# Patient Record
Sex: Female | Born: 1995 | Race: White | Hispanic: No | Marital: Married | Smoking: Current some day smoker
Health system: Southern US, Community
[De-identification: ages and names within clinical notes are randomized; demographics above are authoritative.]

## PROBLEM LIST (undated history)

## (undated) ENCOUNTER — Inpatient Hospital Stay (HOSPITAL_COMMUNITY): Payer: Self-pay

## (undated) DIAGNOSIS — G43909 Migraine, unspecified, not intractable, without status migrainosus: Secondary | ICD-10-CM

## (undated) DIAGNOSIS — Q754 Mandibulofacial dysostosis: Secondary | ICD-10-CM

## (undated) HISTORY — PX: TYMPANOSTOMY: SHX2586

## (undated) HISTORY — DX: Mandibulofacial dysostosis: Q75.4

---
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
## 2017-08-18 IMAGING — US US OB TRANSVAGINAL
1 series · 13 of 28 positions shown · non-contrast
Comparison: None.

CLINICAL DATA: 22-year-old pregnant female with abdominal cramping
and recent spotting. Beta HCG 235.

EDC by LMP: [DATE], projecting to an expected gestational age of
8 weeks 6 days
EXAM:
OBSTETRIC <14 WK US AND TRANSVAGINAL OB US
TECHNIQUE: Both transabdominal and transvaginal ultrasound examinations were
performed for complete evaluation of the gestation as well as the
maternal uterus, adnexal regions, and pelvic cul-de-sac.
Transvaginal technique was performed to assess early pregnancy.

[Series 1: us ob transvaginal · 0.25mm/px · 13 of 78 slices shown]
[im 3/78]
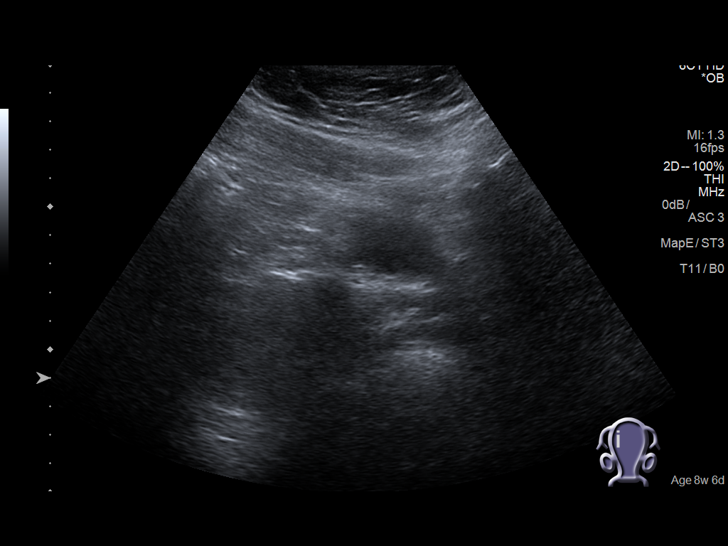
[im 9/78]
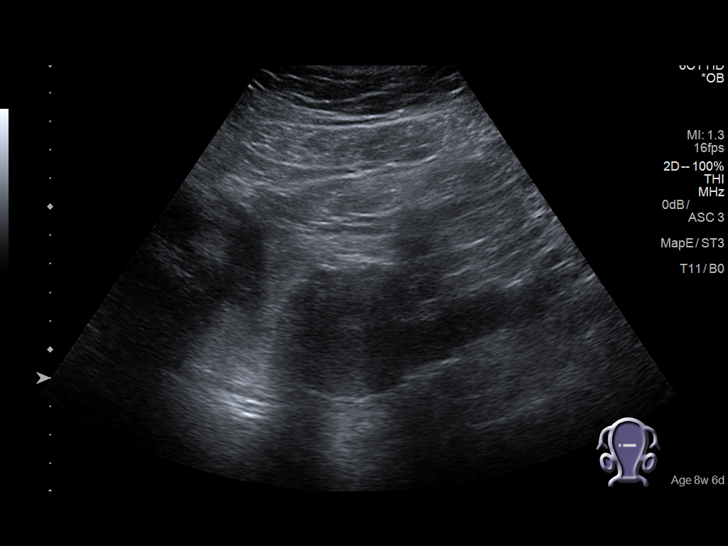
[im 15/78]
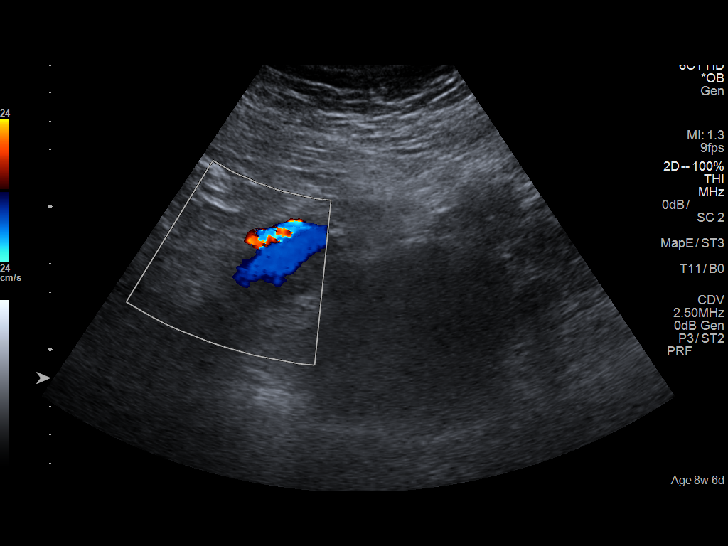
[im 20/78]
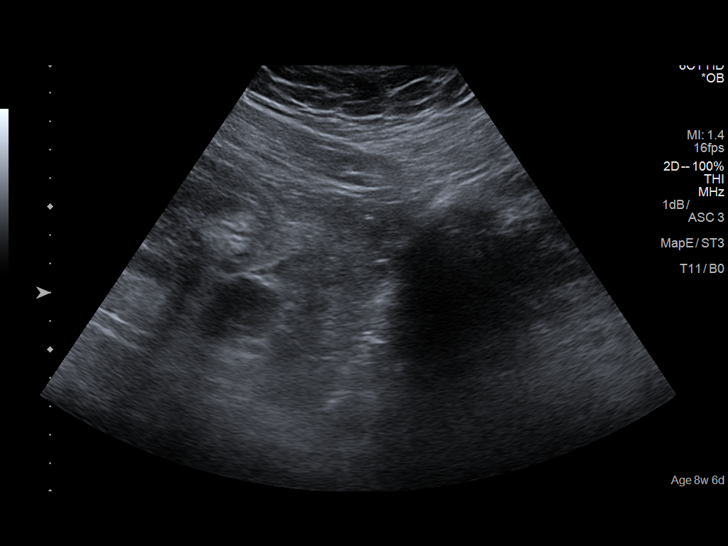
[im 26/78]
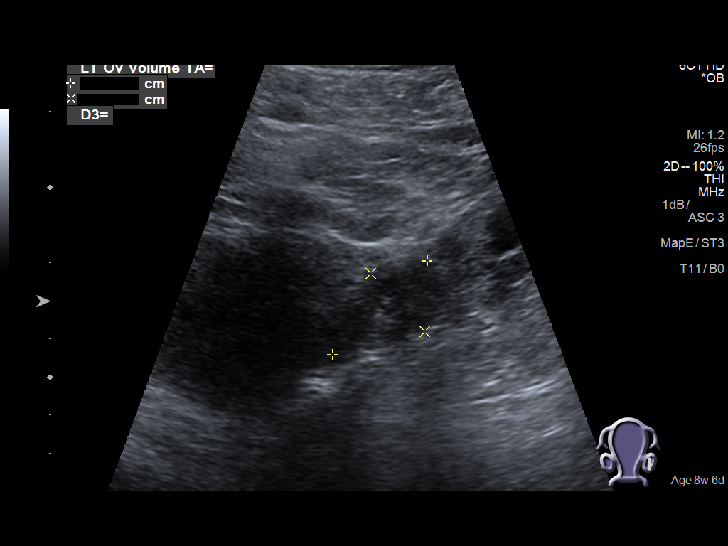
[im 32/78]
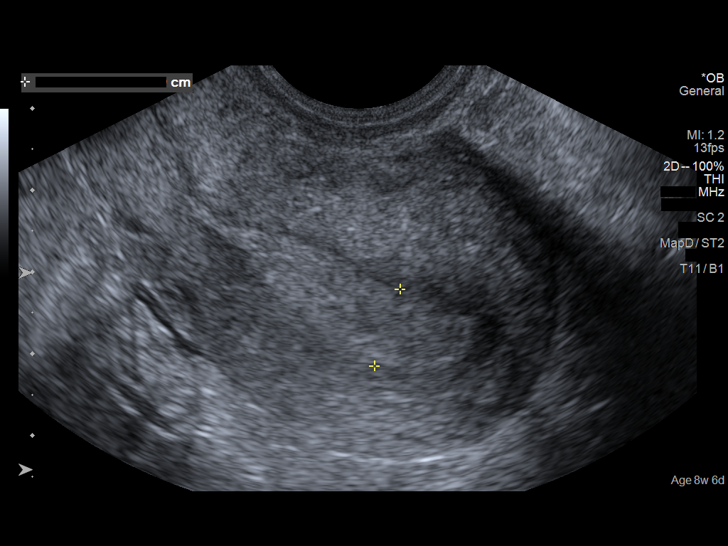
[im 40/78]
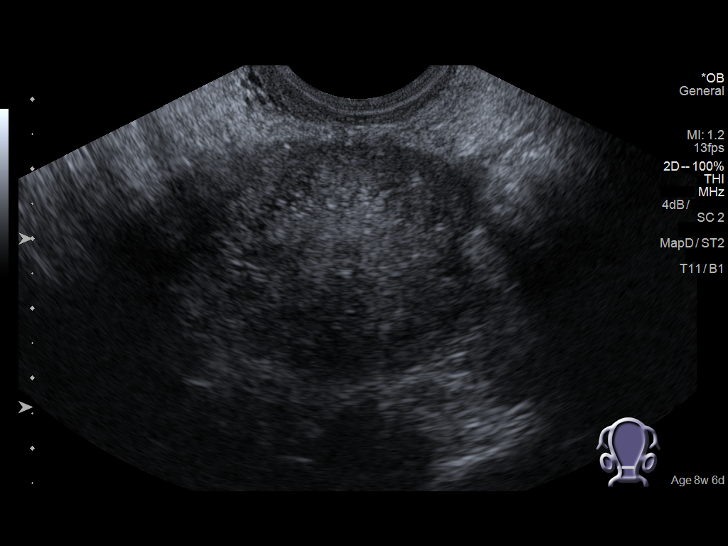
[im 46/78]
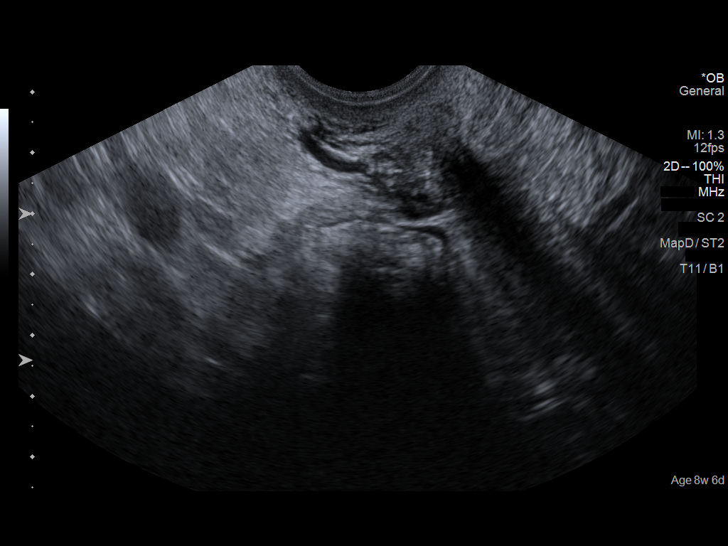
[im 52/78]
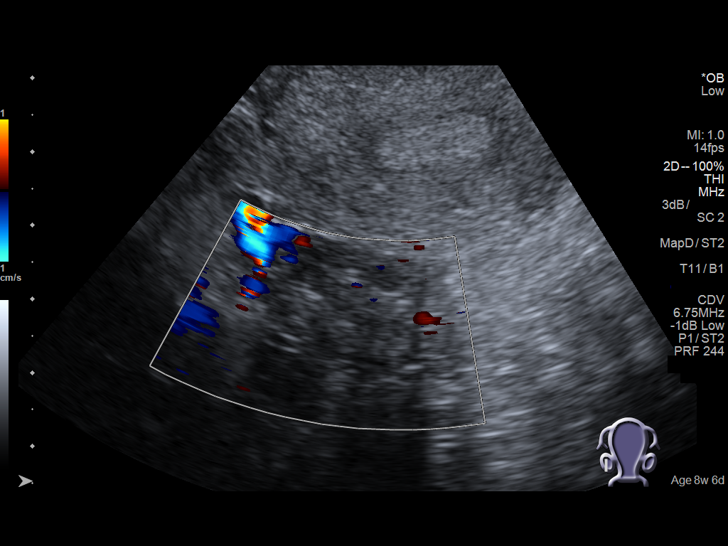
[im 58/78]
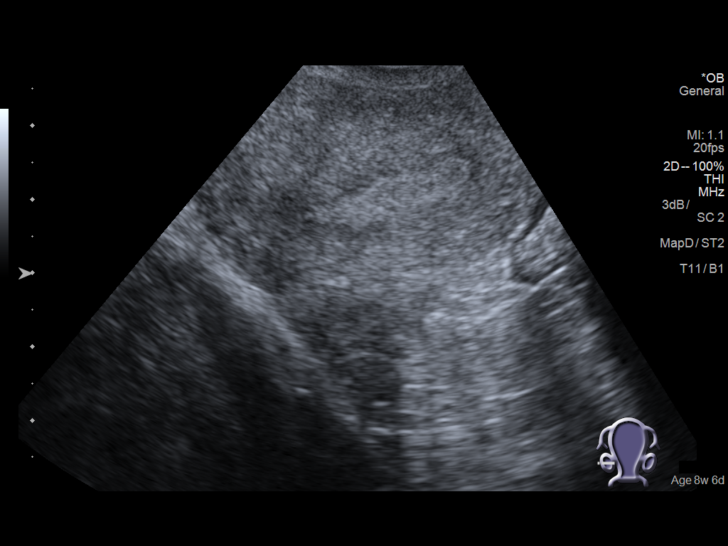
[im 63/78]
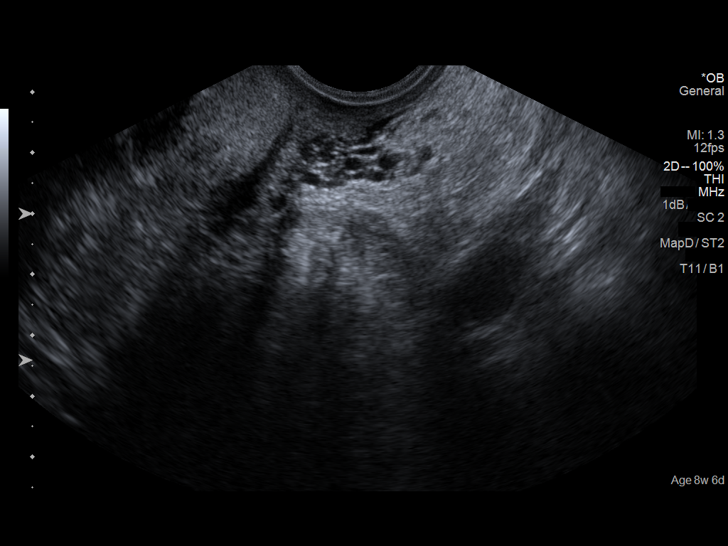
[im 69/78]
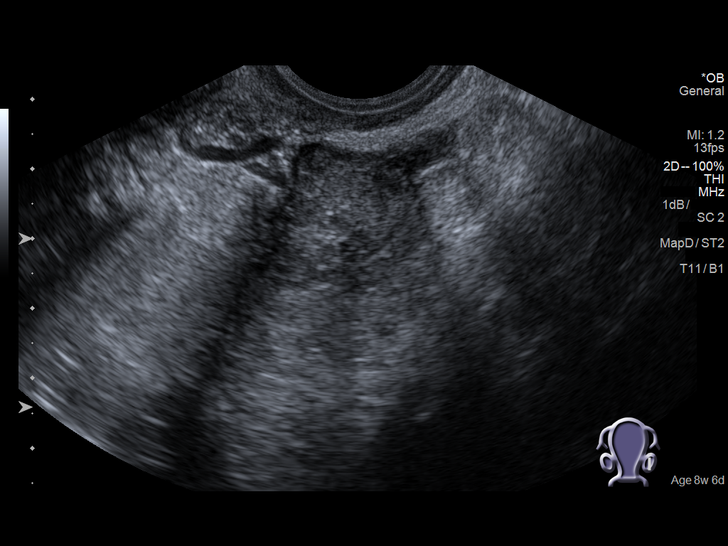
[im 75/78]
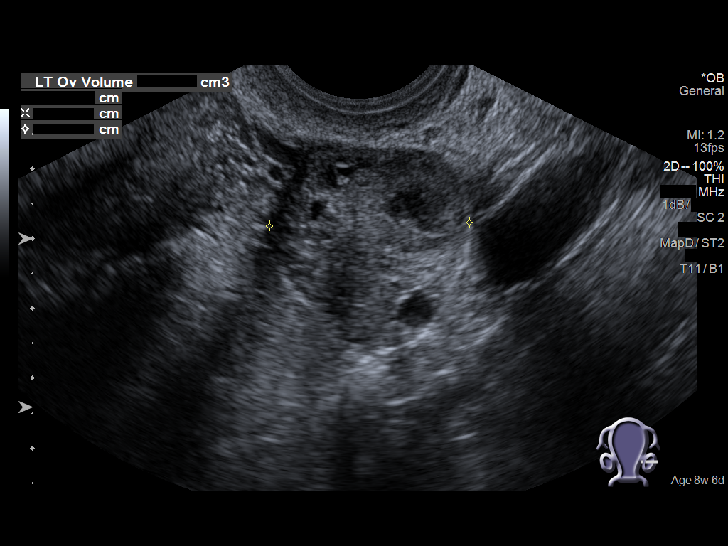

[13 of 28 positions shown; findings below may reference images not displayed]

FINDINGS: Intrauterine gestational sac: None

Maternal uterus/adnexae: Retroverted uterus. Bilayer endometrial
thickness 10 mm. No endometrial cavity fluid or focal endometrial
mass. No uterine fibroids. Right ovary measures 2.7 x 1.7 x 1.8 cm.
Left ovary measures 3.4 x 2.3 x 2.9 cm and contains a corpus luteum.
No abnormal ovarian or adnexal masses. Trace free fluid in the
pelvis.
IMPRESSION: Non-localization of the pregnancy on this scan. No intrauterine
gestational sac. No abnormal ovarian or adnexal masses. Sonographic
differential diagnosis includes intrauterine gestation too early to
visualize, spontaneous abortion or occult ectopic gestation.
Recommend close clinical follow-up and serial serum beta HCG
monitoring, with repeat obstetric scan as warranted by beta HCG
levels and clinical assessment.

## 2018-03-16 ENCOUNTER — Emergency Department: Payer: Medicaid Other

## 2018-03-16 ENCOUNTER — Emergency Department
Admission: EM | Admit: 2018-03-16 | Discharge: 2018-03-16 | Disposition: A | Discharge status: Home / Self Care | Payer: Medicaid Other | Attending: Emergency Medicine | Admitting: Emergency Medicine

## 2018-03-16 ENCOUNTER — Encounter: Payer: Self-pay | Admitting: Emergency Medicine

## 2018-03-16 DIAGNOSIS — R102 Pelvic and perineal pain: Secondary | ICD-10-CM | POA: Diagnosis not present

## 2018-03-16 DIAGNOSIS — R109 Unspecified abdominal pain: Secondary | ICD-10-CM

## 2018-03-16 DIAGNOSIS — Z79899 Other long term (current) drug therapy: Secondary | ICD-10-CM | POA: Diagnosis not present

## 2018-03-16 LAB — CBC
HCT: 39 % (ref 35.0–47.0)
HEMOGLOBIN: 13 g/dL (ref 12.0–16.0)
MCH: 27.8 pg (ref 26.0–34.0)
MCHC: 33.4 g/dL (ref 32.0–36.0)
MCV: 83.4 fL (ref 80.0–100.0)
Platelets: 257 10*3/uL (ref 150–440)
RBC: 4.67 MIL/uL (ref 3.80–5.20)
RDW: 16.3 % — ABNORMAL HIGH (ref 11.5–14.5)
WBC: 9.1 10*3/uL (ref 3.6–11.0)

## 2018-03-16 LAB — URINALYSIS, COMPLETE (UACMP) WITH MICROSCOPIC
BACTERIA UA: NONE SEEN
Bilirubin Urine: NEGATIVE
Glucose, UA: NEGATIVE mg/dL
Hgb urine dipstick: NEGATIVE
Ketones, ur: NEGATIVE mg/dL
Leukocytes, UA: NEGATIVE
Nitrite: NEGATIVE
PROTEIN: NEGATIVE mg/dL
Specific Gravity, Urine: 1.02 (ref 1.005–1.030)
pH: 5 (ref 5.0–8.0)

## 2018-03-16 LAB — COMPREHENSIVE METABOLIC PANEL
ALBUMIN: 3.5 g/dL (ref 3.5–5.0)
ALK PHOS: 63 U/L (ref 38–126)
ALT: 14 U/L (ref 14–54)
AST: 18 U/L (ref 15–41)
Anion gap: 7 (ref 5–15)
BUN: 13 mg/dL (ref 6–20)
CALCIUM: 8.9 mg/dL (ref 8.9–10.3)
CHLORIDE: 107 mmol/L (ref 101–111)
CO2: 24 mmol/L (ref 22–32)
Creatinine, Ser: 0.66 mg/dL (ref 0.44–1.00)
GFR calc Af Amer: >60 mL/min (ref >60)
GFR calc non Af Amer: >60 mL/min (ref >60)
GLUCOSE: 113 mg/dL — AB (ref 65–99)
Potassium: 4.5 mmol/L (ref 3.5–5.1)
SODIUM: 138 mmol/L (ref 135–145)
Total Bilirubin: 0.4 mg/dL (ref 0.3–1.2)
Total Protein: 6.7 g/dL (ref 6.5–8.1)

## 2018-03-16 LAB — WET PREP, GENITAL
Sperm: NONE SEEN
TRICH WET PREP: NONE SEEN
YEAST WET PREP: NONE SEEN

## 2018-03-16 LAB — POCT PREGNANCY, URINE: PREG TEST UR: NEGATIVE

## 2018-03-16 LAB — CHLAMYDIA/NGC RT PCR (ARMC ONLY)
Chlamydia Tr: NOT DETECTED
N GONORRHOEAE: NOT DETECTED

## 2018-03-16 LAB — HCG, QUANTITATIVE, PREGNANCY: HCG, BETA CHAIN, QUANT, S: 235 m[IU]/mL — AB (ref <5)

## 2018-03-16 LAB — LIPASE, BLOOD: LIPASE: 25 U/L (ref 11–51)

## 2018-03-16 MED ORDER — PRENATAL VITAMINS 0.8 MG PO TABS: 1.0000 | ORAL_TABLET | Freq: Every day | ORAL | 0 refills | Status: DC

## 2018-03-16 NOTE — ED Notes (Signed)
Patient transported to Ultrasound 

## 2018-03-16 NOTE — ED Provider Notes (Signed)
[facility] Emergency Department Provider Note ____________________________________________   First MD Initiated Contact with Patient 03/16/18 1723     (approximate)  I have reviewed the triage vital signs and the nursing notes.   HISTORY  Chief Complaint Possible Pregnancy and Abdominal Cramping  HPI Suzanne Frederick is a 22 y.o. female without any chronic medical conditions was presenting to the emergency department today complaining of intermittent lower abdominal pain as well as 2+ pregnancy tests earlier today.  She says that her last normal period was 2 months ago and that she bled only for 2 days in May and is now 2 days late for period in June.  She says that she usually has a regular..  She has never been pregnant before prior to her positive pregnancy test today.  Denies any pain at this time.  Denies any vaginal bleeding or discharge.  No history of STDs.   History reviewed. No pertinent past medical history.  There are no active problems to display for this patient.   History reviewed. No pertinent surgical history.  Prior to Admission medications   Medication Sig Start Date End Date Taking? Authorizing Provider  acetaminophen-codeine (TYLENOL #3) 300-30 MG tablet Take 1 tablet by mouth every 4 (four) hours as needed for moderate pain. 07/20/15   Beers, Charmayne Sheer, PA-C  phenazopyridine (PYRIDIUM) 200 MG tablet Take 1 tablet (200 mg total) by mouth 3 (three) times daily as needed for pain. 07/20/15   Beers, Charmayne Sheer, PA-C  promethazine (PHENERGAN) 25 MG tablet Take 1 tablet (25 mg total) by mouth every 6 (six) hours as needed for nausea or vomiting. 07/20/15   Beers, Charmayne Sheer, PA-C  sulfamethoxazole-trimethoprim (BACTRIM DS,SEPTRA DS) 800-160 MG tablet Take 1 tablet by mouth 2 (two) times daily. 07/20/15   Beers, Charmayne Sheer, PA-C    Allergies Patient has no known allergies.  No family history on file.  Social History Social History   Tobacco  Use  . Smoking status: Never Smoker  . Smokeless tobacco: Never Used  Substance Use Topics  . Alcohol use: No  . Drug use: Not on file    Review of Systems  Constitutional: No fever/chills Eyes: No visual changes. ENT: No sore throat. Cardiovascular: Denies chest pain. Respiratory: Denies shortness of breath. Gastrointestinal:   No nausea, no vomiting.  No diarrhea.  No constipation. Genitourinary: Negative for dysuria. Musculoskeletal: Negative for back pain. Skin: Negative for rash. Neurological: Negative for headaches, focal weakness or numbness.   ____________________________________________   PHYSICAL EXAM:  VITAL SIGNS: ED Triage Vitals  Enc Vitals Group     BP 03/16/18 1510 (!) 114/94     Pulse Rate 03/16/18 1510 99     Resp 03/16/18 1510 18     Temp 03/16/18 1510 99.2 F (37.3 C)     Temp Source 03/16/18 1510 Oral     SpO2 03/16/18 1510 98 %     Weight --      Height 03/16/18 1513 4\' 11"  (1.499 m)     Head Circumference --      Peak Flow --      Pain Score 03/16/18 1510 3     Pain Loc --      Pain Edu? --      Excl. in GC? --     Constitutional: Alert and oriented. Well appearing and in no acute distress. Eyes: Conjunctivae are normal.  Head: Atraumatic. Nose: No congestion/rhinnorhea. Mouth/Throat: Mucous membranes are moist.  Neck: No  stridor.   Cardiovascular: Normal rate, regular rhythm. Grossly normal heart sounds.   Respiratory: Normal respiratory effort.  No retractions. Lungs CTAB. Gastrointestinal: Soft and nontender. No distention. No CVA tenderness. Genitourinary: Normal external exam without any lesions.  Speculum exam without any bleeding.  Minimal cervical mucus versus white discharge.  Bimanual exam without CMT.  No uterine or adnexal masses nor tenderness but the patient's exam may be confounded by obesity. Musculoskeletal: No lower extremity tenderness nor edema.  No joint effusions. Neurologic:  Normal speech and language. No gross  focal neurologic deficits are appreciated. Skin:  Skin is warm, dry and intact. No rash noted. Psychiatric: Mood and affect are normal. Speech and behavior are normal.  ____________________________________________   LABS (all labs ordered are listed, but only abnormal results are displayed)  Labs Reviewed  WET PREP, GENITAL - Abnormal; Notable for the following components:      Result Value   Clue Cells Wet Prep HPF POC FEW (*)    WBC, Wet Prep HPF POC FEW (*)    All other components within normal limits  COMPREHENSIVE METABOLIC PANEL - Abnormal; Notable for the following components:   Glucose, Bld 113 (*)    All other components within normal limits  CBC - Abnormal; Notable for the following components:   RDW 16.3 (*)    All other components within normal limits  URINALYSIS, COMPLETE (UACMP) WITH MICROSCOPIC - Abnormal; Notable for the following components:   Color, Urine YELLOW (*)    APPearance CLOUDY (*)    All other components within normal limits  HCG, QUANTITATIVE, PREGNANCY - Abnormal; Notable for the following components:   hCG, Beta Chain, Quant, S 235 (*)    All other components within normal limits  CHLAMYDIA/NGC RT PCR ([facility] ONLY)  LIPASE, BLOOD  POC URINE PREG, ED  POCT PREGNANCY, URINE   ____________________________________________  EKG   ____________________________________________  RADIOLOGY  Ultrasound with non-localization of the pregnancy.  No intrauterine gestational sac.  No abnormal ovarian or adnexal masses. ____________________________________________   PROCEDURES  Procedure(s) performed:   Procedures  Critical Care performed:   ____________________________________________   INITIAL IMPRESSION / ASSESSMENT AND PLAN / ED COURSE  Pertinent labs & imaging results that were available during my care of the patient were reviewed by me and considered in my medical decision making (see chart for details).  Differential diagnosis  includes, but is not limited to, ovarian cyst, ovarian torsion, acute appendicitis, diverticulitis, urinary tract infection/pyelonephritis, endometriosis, bowel obstruction, colitis, renal colic, gastroenteritis, hernia, fibroids, endometriosis, pregnancy related pain including ectopic pregnancy, etc. As part of my medical decision making, I reviewed the following data within the electronic MEDICAL RECORD NUMBER Notes from prior ED visits  ----------------------------------------- 9:50 PM on 03/16/2018 -----------------------------------------  Patient asleep when I entered the room.  Woken easily.  Very benign overall exam.  It is not surprising that there was nonlocalization of the pregnancy on ultrasound as the beta is only 235.  Patient likely very early on in pregnancy.  We discussed that she must have a repeat beta-hCG in 48 hours and must return to the emergency department for any worsening concerning symptoms.  She will be given a prescription for prenatal vitamins.  She is understanding of the diagnosis as well as the treatment plan and willing to comply. ____________________________________________   FINAL CLINICAL IMPRESSION(S) / ED DIAGNOSES  Final diagnoses:  Pelvic pain  First trimester abdominal pain.    NEW MEDICATIONS STARTED DURING THIS VISIT:  New Prescriptions  No medications on file     Note:  This document was prepared using Dragon voice recognition software and may include unintentional dictation errors.     Myrna BlazerSchaevitz, Prudencio Velazco Matthew, MD 03/16/18 2151

## 2018-03-16 NOTE — ED Notes (Signed)
Pt c/o suprapubic cramping today, states she had 2 positive pregnancy test at home today. Denies any bleeding. States she does have some pain with urination.

## 2018-03-16 NOTE — ED Notes (Signed)

## 2018-03-16 NOTE — ED Triage Notes (Signed)
Patient presents to the ED with lower abdominal cramping that began last night around 7pm.  Patient states she took a pregnancy test and it was positive.  Patient states she had a very short period on April 3rd and her last normal period was March 5th.  Patient is in no obvious distress at this time.  Laughing during triage.  Patient denies vaginal bleeding.  Patient denies vomiting.

## 2018-03-19 ENCOUNTER — Ambulatory Visit (INDEPENDENT_AMBULATORY_CARE_PROVIDER_SITE_OTHER): Payer: Self-pay | Admitting: Obstetrics & Gynecology

## 2018-03-19 ENCOUNTER — Other Ambulatory Visit: Payer: Self-pay | Admitting: Obstetrics & Gynecology

## 2018-03-19 ENCOUNTER — Encounter: Payer: Self-pay | Admitting: Obstetrics & Gynecology

## 2018-03-19 ENCOUNTER — Ambulatory Visit: Payer: Self-pay | Admitting: Obstetrics & Gynecology

## 2018-03-19 VITALS — BP 120/80 | Ht 59.0 in | Wt 220.0 lb

## 2018-03-19 DIAGNOSIS — N926 Irregular menstruation, unspecified: Secondary | ICD-10-CM

## 2018-03-19 DIAGNOSIS — N921 Excessive and frequent menstruation with irregular cycle: Secondary | ICD-10-CM

## 2018-03-19 LAB — BETA HCG QUANT (REF LAB): hCG Quant: 682 m[IU]/mL

## 2018-03-19 NOTE — Progress Notes (Signed)
   Patient at 4-[redacted] weeks gestation by LMP.  Patient reports irregular menses since this year.  She is not in acute distress.  Ectopic risks include none. Cycle length is irregular. Pregnancy testing: at home and quant HCG level 235 on 03/16/18. Pregnancy imaging: transvaginal ultrasound done on 6/4. Result too early to tell, no mass or fluid.  Blood type: unk.  Other lab results: none.   PMHx: She  has no past medical history on file. Also,  has no past surgical history on file., family history is not on file.,  reports that she has never smoked. She has never used smokeless tobacco. She reports that she does not drink alcohol.  She has a current medication list which includes the following prescription(s): propranolol, acetaminophen-codeine, phenazopyridine, prenatal vitamins, promethazine, and sulfamethoxazole-trimethoprim. Also, has No Known Allergies.  Review of Systems  Constitutional: Negative for chills, fever and malaise/fatigue.  HENT: Negative for congestion, sinus pain and sore throat.   Eyes: Negative for blurred vision and pain.  Respiratory: Negative for cough and wheezing.   Cardiovascular: Negative for chest pain and leg swelling.  Gastrointestinal: Negative for abdominal pain, constipation, diarrhea, heartburn, nausea and vomiting.  Genitourinary: Negative for dysuria, frequency, hematuria and urgency.  Musculoskeletal: Negative for back pain, joint pain, myalgias and neck pain.  Skin: Negative for itching and rash.  Neurological: Negative for dizziness, tremors and weakness.  Endo/Heme/Allergies: Does not bruise/bleed easily.  Psychiatric/Behavioral: Negative for depression. The patient is not nervous/anxious and does not have insomnia.    Objective: BP 120/80   Ht 4\' 11"  (1.499 m)   Wt 220 lb (99.8 kg)   LMP 01/13/2018   BMI 44.43 kg/m  Physical Exam  Constitutional: She is oriented to person, place, and time. She appears well-developed and well-nourished. No distress.    Musculoskeletal: Normal range of motion.  Neurological: She is alert and oriented to person, place, and time.  Skin: Skin is warm and dry.  Psychiatric: She has a normal mood and affect.  Vitals reviewed.  ASSESSMENT/PLAN:    Irregular menses    -  Primary   Relevant Orders   Beta hCG quant (ref lab) Monitor for sx's of miscarriage or ectopic F/U one week; US when appropriate PNV samples given (Rx to follow) Sch NOB      Annamarie MajorPaul Elodie Panameno, MD, Merlinda FrederickFACOG [facility], [facility] 03/19/2018  11:33 AM

## 2018-03-25 ENCOUNTER — Telehealth: Payer: Self-pay

## 2018-03-25 NOTE — Telephone Encounter (Signed)
After Hours Triage call: Pt reports she is [redacted] wks pregnant & started bleeding this morning. Had phone sex last night. [id number]#[phone]#[phone]

## 2018-03-29 ENCOUNTER — Other Ambulatory Visit: Payer: Self-pay | Admitting: Obstetrics & Gynecology

## 2018-03-29 ENCOUNTER — Ambulatory Visit (INDEPENDENT_AMBULATORY_CARE_PROVIDER_SITE_OTHER): Payer: Self-pay

## 2018-03-29 ENCOUNTER — Ambulatory Visit (INDEPENDENT_AMBULATORY_CARE_PROVIDER_SITE_OTHER): Payer: Self-pay | Admitting: Obstetrics & Gynecology

## 2018-03-29 ENCOUNTER — Encounter: Payer: Self-pay | Admitting: Obstetrics & Gynecology

## 2018-03-29 VITALS — BP 120/80 | Ht 59.0 in | Wt 221.0 lb

## 2018-03-29 DIAGNOSIS — O209 Hemorrhage in early pregnancy, unspecified: Secondary | ICD-10-CM

## 2018-03-29 DIAGNOSIS — N921 Excessive and frequent menstruation with irregular cycle: Secondary | ICD-10-CM

## 2018-03-29 DIAGNOSIS — Z3A10 10 weeks gestation of pregnancy: Secondary | ICD-10-CM

## 2018-03-29 NOTE — Patient Instructions (Signed)

## 2018-03-29 NOTE — Progress Notes (Signed)
  HPI: Threatened Abortion Patient [redacted] weeks gestation by LMP.  Patient reports bleeding since 6 days ago.  She is not in acute distress.  Ectopic risks include none. Cycle length is regular. Pregnancy testing: at home. Pregnancy imaging: done.  Blood type: unk.  Other lab results: none.  Ultrasound demonstrates IUP w FHT 105 These findings are reassuring w new EDC  PMHx: She  has a past medical history of Treacher Collins syndrome. Also,  has no past surgical history on file., family history includes Treacher Collins syndrome in her sister.,  reports that she has never smoked. She has never used smokeless tobacco. She reports that she does not drink alcohol.  She has a current medication list which includes the following prescription(s): prenatal vitamins, propranolol, acetaminophen-codeine, phenazopyridine, promethazine, and sulfamethoxazole-trimethoprim. Also, has No Known Allergies.  Review of Systems  All other systems reviewed and are negative.   Objective: BP 120/80   Ht 4\' 11"  (1.499 m)   Wt 221 lb (100.2 kg)   LMP 01/13/2018   BMI 44.64 kg/m   Physical examination Constitutional NAD, Conversant  Skin No rashes, lesions or ulceration.   Extremities: Moves all appropriately.  Normal ROM for age. No lymphadenopathy.  Neuro: Grossly intact  Psych: Oriented to PPT.  Normal mood. Normal affect.   No results found.  Assessment:  First trimester bleeding - Plan: US OB Transvaginal US f/u w NOB 2 weeks, sooner if new bleeding occurs  Annamarie MajorPaul Harris, MD, Merlinda FrederickFACOG [facility], [facility] 03/29/2018  2:24 PM

## 2018-03-29 NOTE — Telephone Encounter (Signed)
Pt seen in clinic today.  

## 2018-04-13 ENCOUNTER — Encounter: Payer: Self-pay | Admitting: Obstetrics and Gynecology

## 2018-04-13 ENCOUNTER — Ambulatory Visit (INDEPENDENT_AMBULATORY_CARE_PROVIDER_SITE_OTHER): Payer: Self-pay | Admitting: Obstetrics and Gynecology

## 2018-04-13 ENCOUNTER — Ambulatory Visit (INDEPENDENT_AMBULATORY_CARE_PROVIDER_SITE_OTHER): Payer: Self-pay

## 2018-04-13 VITALS — BP 126/84 | Ht 59.0 in | Wt 272.0 lb

## 2018-04-13 DIAGNOSIS — Z3A08 8 weeks gestation of pregnancy: Secondary | ICD-10-CM

## 2018-04-13 DIAGNOSIS — N8312 Corpus luteum cyst of left ovary: Secondary | ICD-10-CM

## 2018-04-13 DIAGNOSIS — Z3401 Encounter for supervision of normal first pregnancy, first trimester: Secondary | ICD-10-CM

## 2018-04-13 DIAGNOSIS — Z124 Encounter for screening for malignant neoplasm of cervix: Secondary | ICD-10-CM

## 2018-04-13 DIAGNOSIS — O209 Hemorrhage in early pregnancy, unspecified: Secondary | ICD-10-CM

## 2018-04-13 DIAGNOSIS — O219 Vomiting of pregnancy, unspecified: Secondary | ICD-10-CM

## 2018-04-13 DIAGNOSIS — O3481 Maternal care for other abnormalities of pelvic organs, first trimester: Secondary | ICD-10-CM

## 2018-04-13 DIAGNOSIS — Z6841 Body Mass Index (BMI) 40.0 and over, adult: Secondary | ICD-10-CM

## 2018-04-13 MED ORDER — DOXYLAMINE SUCCINATE (SLEEP) 25 MG PO TABS: 25.0000 mg | ORAL_TABLET | Freq: Every day | ORAL | 11 refills | Status: DC

## 2018-04-13 MED ORDER — FOLIC ACID 1 MG PO TABS
1.0000 mg | ORAL_TABLET | Freq: Every day | ORAL | 11 refills | Status: DC
Start: 2018-04-13 — End: 2018-05-11

## 2018-04-13 MED ORDER — PYRIDOXINE HCL 25 MG PO TABS: 25.0000 mg | ORAL_TABLET | Freq: Four times a day (QID) | ORAL | 11 refills | Status: DC | PRN

## 2018-04-13 NOTE — Progress Notes (Signed)
NOB  U/S Severe Nausea

## 2018-04-13 NOTE — Progress Notes (Signed)
Patient ID: Suzanne LinkMadison D Marchetta, female   DOB: 03/18/1996, 22 y.o.   MRN: [id number]  Reason for Consult: Initial Prenatal Visit   [facility]erred by [facility]  Subjective:     HPI:  Suzanne Frederick is a [age] y.o. female . She is following up today for an ultrasound to confirm her pregnancy. She has been doing well. She has had some nausea vomiting. She reports that she generally vomits about 2 times a week. She was not yet started any medications of therapies. She is taking a prenatal vitamin. She is not having any vaginal spotting. US today confirms her due date for this pregnancy will be 11/21/2017  Past Medical History:  Diagnosis Date  . Treacher Collins syndrome    Family History  Problem Relation Age of Onset  . Treacher Collins syndrome Sister    History reviewed. No pertinent surgical history.  Short Social History:  Social History   Tobacco Use  . Smoking status: Never Smoker  . Smokeless tobacco: Never Used  Substance Use Topics  . Alcohol use: No    No Known Allergies  Current Outpatient Medications  Medication Sig Dispense Refill  . Prenatal Multivit-Min-Fe-FA (PRENATAL VITAMINS) 0.8 MG tablet Take 1 tablet by mouth daily. 30 tablet 0  . propranolol (INDERAL) 40 MG tablet Take by mouth.    Marland Kitchen. acetaminophen-codeine (TYLENOL #3) 300-30 MG tablet Take 1 tablet by mouth every 4 (four) hours as needed for moderate pain. (Patient not taking: Reported on 03/19/2018) 12 tablet 0  . doxylamine, Sleep, (UNISOM) 25 MG tablet Take 1 tablet (25 mg total) by mouth at bedtime. 30 tablet 11  . folic acid (FOLVITE) 1 MG tablet Take 1 tablet (1 mg total) by mouth daily. 30 tablet 11  . phenazopyridine (PYRIDIUM) 200 MG tablet Take 1 tablet (200 mg total) by mouth 3 (three) times daily as needed for pain. (Patient not taking: Reported on 03/19/2018) 6 tablet 0  . promethazine (PHENERGAN) 25 MG tablet Take 1 tablet (25 mg total) by mouth every 6 (six) hours as needed for nausea or  vomiting. (Patient not taking: Reported on 03/19/2018) 15 tablet 0  . pyridOXINE (VITAMIN B-6) 25 MG tablet Take 1 tablet (25 mg total) by mouth 4 (four) times daily as needed (nausea and vomiting). 120 tablet 11  . sulfamethoxazole-trimethoprim (BACTRIM DS,SEPTRA DS) 800-160 MG tablet Take 1 tablet by mouth 2 (two) times daily. (Patient not taking: Reported on 03/19/2018) 20 tablet 0   No current facility-administered medications for this visit.     Review of Systems  Constitutional: Negative for chills, fatigue, fever and unexpected weight change.  HENT: Negative for trouble swallowing.  Eyes: Negative for loss of vision.  Respiratory: Negative for cough, shortness of breath and wheezing.  Cardiovascular: Negative for chest pain, leg swelling, palpitations and syncope.  GI: Negative for abdominal pain, blood in stool, diarrhea, nausea and vomiting.  GU: Negative for difficulty urinating, dysuria, frequency and hematuria.  Musculoskeletal: Negative for back pain, leg pain and joint pain.  Skin: Negative for rash.  Neurological: Negative for dizziness, headaches, light-headedness, numbness and seizures.  Psychiatric: Negative for behavioral problem, confusion, depressed mood and sleep disturbance.        Objective:  Objective   Vitals:   04/13/18 0954  BP: 126/84  Weight: 272 lb (123.4 kg)   Body mass index is 54.94 kg/m.  Physical Exam  Constitutional: She is oriented to person, place, and time. She appears well-developed and well-nourished.  HENT:  Head: Normocephalic and atraumatic.  Eyes: Pupils are equal, round, and reactive to light. EOM are normal.  Cardiovascular: Normal rate.  Pulmonary/Chest: Effort normal. No respiratory distress.  Neurological: She is alert and oriented to person, place, and time.  Skin: Skin is warm and dry.  Psychiatric: She has a normal mood and affect. Her behavior is normal. Judgment and thought content normal.  Nursing note and vitals  reviewed.       Assessment/Plan:    22 yo G1P0 [redacted]w[redacted]d by LMP = 8 week [place] Hayleigh's BMI is more than 50.  We discussed weight gain goals and exercise initiation during pregnancy. Patient does not currently exercise. I encouraged her to try adding in walks 2 times a day.  Will have patient follow up with a [facility] to establish pregnancy care.  Prescriptions sent for B6, Unisom and folic acid.   More than 25 minutes were spent face to face with the patient in the room with more than 50% of the time spent providing counseling and discussing the plan of management.    Adelene Idler MD [facility], [facility] 04/13/18 10:59 AM

## 2018-04-15 LAB — URINE DRUG PANEL 7
AMPHETAMINES, URINE: NEGATIVE ng/mL
BENZODIAZEPINE QUANT UR: NEGATIVE ng/mL
Barbiturate Quant, Ur: NEGATIVE ng/mL
Cannabinoid Quant, Ur: NEGATIVE ng/mL
Cocaine (Metab.): NEGATIVE ng/mL
Opiate Quant, Ur: NEGATIVE ng/mL
PCP Quant, Ur: NEGATIVE ng/mL

## 2018-04-16 ENCOUNTER — Telehealth: Payer: Self-pay | Admitting: Radiology

## 2018-04-16 NOTE — Telephone Encounter (Signed)
Left voicemail for patient to call [id number] to schedule New OB appointment.

## 2018-05-11 ENCOUNTER — Other Ambulatory Visit (HOSPITAL_COMMUNITY)
Admission: RE | Admit: 2018-05-11 | Discharge: 2018-05-11 | Disposition: A | Discharge status: Home / Self Care | Payer: Medicaid Other | Source: Ambulatory Visit | Attending: Family Medicine | Admitting: Family Medicine

## 2018-05-11 ENCOUNTER — Encounter: Payer: Self-pay | Admitting: Family Medicine

## 2018-05-11 ENCOUNTER — Ambulatory Visit (INDEPENDENT_AMBULATORY_CARE_PROVIDER_SITE_OTHER): Payer: Medicaid Other | Admitting: Family Medicine

## 2018-05-11 VITALS — BP 154/91 | HR 107 | Wt 268.0 lb

## 2018-05-11 DIAGNOSIS — F331 Major depressive disorder, recurrent, moderate: Secondary | ICD-10-CM

## 2018-05-11 DIAGNOSIS — Z3A12 12 weeks gestation of pregnancy: Secondary | ICD-10-CM | POA: Diagnosis not present

## 2018-05-11 DIAGNOSIS — O99341 Other mental disorders complicating pregnancy, first trimester: Secondary | ICD-10-CM | POA: Insufficient documentation

## 2018-05-11 DIAGNOSIS — Q754 Mandibulofacial dysostosis: Secondary | ICD-10-CM | POA: Diagnosis not present

## 2018-05-11 DIAGNOSIS — F909 Attention-deficit hyperactivity disorder, unspecified type: Secondary | ICD-10-CM | POA: Insufficient documentation

## 2018-05-11 DIAGNOSIS — R0683 Snoring: Secondary | ICD-10-CM | POA: Insufficient documentation

## 2018-05-11 DIAGNOSIS — O99211 Obesity complicating pregnancy, first trimester: Secondary | ICD-10-CM

## 2018-05-11 DIAGNOSIS — Z3401 Encounter for supervision of normal first pregnancy, first trimester: Secondary | ICD-10-CM

## 2018-05-11 DIAGNOSIS — O99210 Obesity complicating pregnancy, unspecified trimester: Secondary | ICD-10-CM

## 2018-05-11 DIAGNOSIS — O219 Vomiting of pregnancy, unspecified: Secondary | ICD-10-CM

## 2018-05-11 DIAGNOSIS — Z87891 Personal history of nicotine dependence: Secondary | ICD-10-CM | POA: Diagnosis not present

## 2018-05-11 DIAGNOSIS — O0990 Supervision of high risk pregnancy, unspecified, unspecified trimester: Secondary | ICD-10-CM | POA: Insufficient documentation

## 2018-05-11 DIAGNOSIS — Z3491 Encounter for supervision of normal pregnancy, unspecified, first trimester: Secondary | ICD-10-CM

## 2018-05-11 MED ORDER — PRENATAL VITAMIN 27-0.8 MG PO TABS: 1.0000 | ORAL_TABLET | Freq: Every day | ORAL | 3 refills | Status: DC

## 2018-05-11 MED ORDER — DOXYLAMINE-PYRIDOXINE 10-10 MG PO TBEC: 1.0000 | DELAYED_RELEASE_TABLET | Freq: Four times a day (QID) | ORAL | 3 refills | Status: DC

## 2018-05-11 MED ORDER — SERTRALINE HCL 100 MG PO TABS
100.0000 mg | ORAL_TABLET | Freq: Every day | ORAL | 3 refills | Status: DC
Start: 2018-05-11 — End: 2018-10-26

## 2018-05-11 MED ORDER — ASPIRIN 81 MG PO CHEW: 81.0000 mg | CHEWABLE_TABLET | Freq: Every day | ORAL | 3 refills | Status: DC

## 2018-05-11 NOTE — Progress Notes (Signed)
Subjective:   Suzanne Frederick is a 22 y.o. G1P0 at 6630w2d by early ultrasound being seen today for her first obstetrical visit.  Her obstetrical history is significant for obesity and Treacher Collins. Patient does intend to breast feed. Pregnancy history fully reviewed.  Patient reports nausea.  HISTORY: OB History  Gravida Para Term Preterm AB Living  1 0 0 0 0 0  SAB TAB Ectopic Multiple Live Births  0 0 0 0 0    # Outcome Date GA Lbr Len/2nd Weight Sex Delivery Anes PTL Lv  1 Current             Past Medical History:  Diagnosis Date  . Treacher Collins syndrome    Past Surgical History:  Procedure Laterality Date  . TYMPANOSTOMY     Family History  Problem Relation Age of Onset  . Treacher Collins syndrome Sister   . ADD / ADHD Sister   . Diabetes Mother   . Hypertension Mother   . Hypertension Father   . Hypertension Paternal Uncle   . Hypertension Maternal Grandfather   . Hypertension Paternal Grandmother   . Hyperlipidemia Paternal Grandmother   . Hypertension Paternal Grandfather   . Hyperlipidemia Paternal Grandfather   . Cancer Maternal Uncle    Social History   Tobacco Use  . Smoking status: Former Smoker    Last attempt to quit: 03/11/2018    Years since quitting: 0.1  . Smokeless tobacco: Never Used  Substance Use Topics  . Alcohol use: No  . Drug use: Not on file   No Known Allergies Current Outpatient Medications on File Prior to Visit  Medication Sig Dispense Refill  . propranolol (INDERAL) 40 MG tablet Take by mouth.     No current facility-administered medications on file prior to visit.      Exam   Vitals:   05/11/18 1014  BP: (!) 154/91  Pulse: (!) 107  Weight: 268 lb (121.6 kg)      Uterus:   12 wk size  Pelvic Exam: Perineum: no hemorrhoids, normal perineum   Vulva: normal external genitalia, no lesions   Vagina:  normal mucosa, normal discharge   Cervix: no lesions and normal, pap smear done.    Adnexa: normal  adnexa and no mass, fullness, tenderness   Bony Pelvis: average  System: General: well-developed, well-nourished female in no acute distress   Breast:  normal appearance, no masses or tenderness   Skin: normal coloration and turgor, no rashes   Neurologic: oriented, normal, negative, normal mood   Extremities: normal strength, tone, and muscle mass, ROM of all joints is normal   HEENT extraocular movement intact and sclera clear, anicteric   Mouth/Teeth mucous membranes moist, pharynx normal without lesions and dental hygiene good   Neck supple and no masses   Cardiovascular: regular rate and rhythm   Respiratory:  no respiratory distress, normal breath sounds   Abdomen: soft, non-tender; bowel sounds normal; no masses,  no organomegaly     Assessment:   Pregnancy: G1P0 Patient Active Problem List   Diagnosis Date Noted  . Encounter for supervision of low-risk pregnancy in first trimester 05/11/2018  . Obesity complicating peripregnancy, antepartum 05/11/2018  . Obesity, morbid, BMI 50 or higher (HCC) 05/11/2018  . Nausea and vomiting of pregnancy, antepartum 05/11/2018  . ADHD (attention deficit hyperactivity disorder) 02/22/2013  . Depression 02/22/2013  . Menorrhagia 02/22/2013  . Headache 02/22/2013  . 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  Plan:  1. Encounter for supervision of low-risk pregnancy in first trimester New OB labs - Obstetric Panel, Including HIV - Hemoglobinopathy evaluation - Cytology - PAP - Cystic Fibrosis Mutation 97 - Culture, OB Urine - SMN1 Copy Number Analysis - Genetic Screening - Prenatal Vit-Fe Fumarate-FA (PRENATAL VITAMIN) 27-0.8 MG TABS; Take 1 tablet by mouth daily.  Dispense: 90 tablet; Refill: 3 - Enroll Patient in [employer]s  2. Moderate episode of recurrent major depressive disorder (HCC) Ok to resume zoloft--needs this right now - sertraline (ZOLOFT) 100 MG tablet; Take 1 tablet (100 mg total) by mouth daily.  Dispense: 90  tablet; Refill: 3  3. Treacher Collins syndrome Autosomal dominant - Ambulatory referral to Genetics  4. Nausea and vomiting of pregnancy, antepartum - Doxylamine-Pyridoxine (DICLEGIS) 10-10 MG TBEC; Take 1 tablet by mouth 4 (four) times daily. Take 2 pills q hs, add one q am, one at lunch  Dispense: 100 tablet; Refill: 3  5. Obesity complicating peripregnancy, antepartum - aspirin 81 MG chewable tablet; Chew 1 tablet (81 mg total) by mouth daily.  Dispense: 90 tablet; Refill: 3 - Hemoglobin A1c - Declined nutrition referral Watch BP--upset today  6. Snoring - Ambulatory referral to Sleep Studies  Initial labs drawn. Continue prenatal vitamins. Genetic Screening discussed, NIPS: ordered. Ultrasound discussed; fetal anatomic survey: Problem list reviewed and updated. The nature of [facility] with multiple MDs and other Advanced Practice Providers was explained to patient; also emphasized that residents, students are part of our team. Routine obstetric precautions reviewed. Return in 1 month (on 06/08/2018).

## 2018-05-11 NOTE — Patient Instructions (Addendum)
 Breastfeeding Choosing to breastfeed is one of the best decisions you can make for yourself and your baby. A change in hormones during pregnancy causes your breasts to make breast milk in your milk-producing glands. Hormones prevent breast milk from being released before your baby is born. They also prompt milk flow after birth. Once breastfeeding has begun, thoughts of your baby, as well as his or her sucking or crying, can stimulate the release of milk from your milk-producing glands. Benefits of breastfeeding Research shows that breastfeeding offers many health benefits for infants and mothers. It also offers a cost-free and convenient way to feed your baby. For your baby  Your first milk (colostrum) helps your baby's digestive system to function better.  Special cells in your milk (antibodies) help your baby to fight off infections.  Breastfed babies are less likely to develop asthma, allergies, obesity, or type 2 diabetes. They are also at lower risk for sudden infant death syndrome (SIDS).  Nutrients in breast milk are better able to meet your baby's needs compared to infant formula.  Breast milk improves your baby's brain development. For you  Breastfeeding helps to create a very special bond between you and your baby.  Breastfeeding is convenient. Breast milk costs nothing and is always available at the correct temperature.  Breastfeeding helps to burn calories. It helps you to lose the weight that you gained during pregnancy.  Breastfeeding makes your uterus return faster to its size before pregnancy. It also slows bleeding (lochia) after you give birth.  Breastfeeding helps to lower your risk of developing type 2 diabetes, osteoporosis, rheumatoid arthritis, cardiovascular disease, and breast, ovarian, uterine, and endometrial cancer later in life. Breastfeeding basics Starting breastfeeding  Find a comfortable place to sit or lie down, with your neck and back  well-supported.  Place a pillow or a rolled-up blanket under your baby to bring him or her to the level of your breast (if you are seated). Nursing pillows are specially designed to help support your arms and your baby while you breastfeed.  Make sure that your baby's tummy (abdomen) is facing your abdomen.  Gently massage your breast. With your fingertips, massage from the outer edges of your breast inward toward the nipple. This encourages milk flow. If your milk flows slowly, you may need to continue this action during the feeding.  Support your breast with 4 fingers underneath and your thumb above your nipple (make the letter "C" with your hand). Make sure your fingers are well away from your nipple and your baby's mouth.  Stroke your baby's lips gently with your finger or nipple.  When your baby's mouth is open wide enough, quickly bring your baby to your breast, placing your entire nipple and as much of the areola as possible into your baby's mouth. The areola is the colored area around your nipple. ? More areola should be visible above your baby's upper lip than below the lower lip. ? Your baby's lips should be opened and extended outward (flanged) to ensure an adequate, comfortable latch. ? Your baby's tongue should be between his or her lower gum and your breast.  Make sure that your baby's mouth is correctly positioned around your nipple (latched). Your baby's lips should create a seal on your breast and be turned out (everted).  It is common for your baby to suck about 2-3 minutes in order to start the flow of breast milk. Latching Teaching your baby how to latch onto your breast properly is   very important. An improper latch can cause nipple pain, decreased milk supply, and poor weight gain in your baby. Also, if your baby is not latched onto your nipple properly, he or she may swallow some air during feeding. This can make your baby fussy. Burping your baby when you switch breasts  during the feeding can help to get rid of the air. However, teaching your baby to latch on properly is still the best way to prevent fussiness from swallowing air while breastfeeding. Signs that your baby has successfully latched onto your nipple  Silent tugging or silent sucking, without causing you pain. Infant's lips should be extended outward (flanged).  Swallowing heard between every 3-4 sucks once your milk has started to flow (after your let-down milk reflex occurs).  Muscle movement above and in front of his or her ears while sucking.  Signs that your baby has not successfully latched onto your nipple  Sucking sounds or smacking sounds from your baby while breastfeeding.  Nipple pain.  If you think your baby has not latched on correctly, slip your finger into the corner of your baby's mouth to break the suction and place it between your baby's gums. Attempt to start breastfeeding again. Signs of successful breastfeeding Signs from your baby  Your baby will gradually decrease the number of sucks or will completely stop sucking.  Your baby will fall asleep.  Your baby's body will relax.  Your baby will retain a small amount of milk in his or her mouth.  Your baby will let go of your breast by himself or herself.  Signs from you  Breasts that have increased in firmness, weight, and size 1-3 hours after feeding.  Breasts that are softer immediately after breastfeeding.  Increased milk volume, as well as a change in milk consistency and color by the fifth day of breastfeeding.  Nipples that are not sore, cracked, or bleeding.  Signs that your baby is getting enough milk  Wetting at least 1-2 diapers during the first 24 hours after birth.  Wetting at least 5-6 diapers every 24 hours for the first week after birth. The urine should be clear or pale yellow by the age of 5 days.  Wetting 6-8 diapers every 24 hours as your baby continues to grow and develop.  At least 3  stools in a 24-hour period by the age of 5 days. The stool should be soft and yellow.  At least 3 stools in a 24-hour period by the age of 7 days. The stool should be seedy and yellow.  No loss of weight greater than 10% of birth weight during the first 3 days of life.  Average weight gain of 4-7 oz (113-198 g) per week after the age of 4 days.  Consistent daily weight gain by the age of 5 days, without weight loss after the age of 2 weeks. After a feeding, your baby may spit up a small amount of milk. This is normal. Breastfeeding frequency and duration Frequent feeding will help you make more milk and can prevent sore nipples and extremely full breasts (breast engorgement). Breastfeed when you feel the need to reduce the fullness of your breasts or when your baby shows signs of hunger. This is called "breastfeeding on demand." Signs that your baby is hungry include:  Increased alertness, activity, or restlessness.  Movement of the head from side to side.  Opening of the mouth when the corner of the mouth or cheek is stroked (rooting).  Increased sucking   sounds, smacking lips, cooing, sighing, or squeaking.  Hand-to-mouth movements and sucking on fingers or hands.  Fussing or crying.  Avoid introducing a pacifier to your baby in the first 4-6 weeks after your baby is born. After this time, you may choose to use a pacifier. Research has shown that pacifier use during the first year of a baby's life decreases the risk of sudden infant death syndrome (SIDS). Allow your baby to feed on each breast as long as he or she wants. When your baby unlatches or falls asleep while feeding from the first breast, offer the second breast. Because newborns are often sleepy in the first few weeks of life, you may need to awaken your baby to get him or her to feed. Breastfeeding times will vary from baby to baby. However, the following rules can serve as a guide to help you make sure that your baby is  properly fed:  Newborns (babies 4 weeks of age or younger) may breastfeed every 1-3 hours.  Newborns should not go without breastfeeding for longer than 3 hours during the day or 5 hours during the night.  You should breastfeed your baby a minimum of 8 times in a 24-hour period.  Breast milk pumping Pumping and storing breast milk allows you to make sure that your baby is exclusively fed your breast milk, even at times when you are unable to breastfeed. This is especially important if you go back to work while you are still breastfeeding, or if you are not able to be present during feedings. Your lactation consultant can help you find a method of pumping that works best for you and give you guidelines about how long it is safe to store breast milk. Caring for your breasts while you breastfeed Nipples can become dry, cracked, and sore while breastfeeding. The following recommendations can help keep your breasts moisturized and healthy:  Avoid using soap on your nipples.  Wear a supportive bra designed especially for nursing. Avoid wearing underwire-style bras or extremely tight bras (sports bras).  Air-dry your nipples for 3-4 minutes after each feeding.  Use only cotton bra pads to absorb leaked breast milk. Leaking of breast milk between feedings is normal.  Use lanolin on your nipples after breastfeeding. Lanolin helps to maintain your skin's normal moisture barrier. Pure lanolin is not harmful (not toxic) to your baby. You may also hand express a few drops of breast milk and gently massage that milk into your nipples and allow the milk to air-dry.  In the first few weeks after giving birth, some women experience breast engorgement. Engorgement can make your breasts feel heavy, warm, and tender to the touch. Engorgement peaks within 3-5 days after you give birth. The following recommendations can help to ease engorgement:  Completely empty your breasts while breastfeeding or pumping. You  may want to start by applying warm, moist heat (in the shower or with warm, water-soaked hand towels) just before feeding or pumping. This increases circulation and helps the milk flow. If your baby does not completely empty your breasts while breastfeeding, pump any extra milk after he or she is finished.  Apply ice packs to your breasts immediately after breastfeeding or pumping, unless this is too uncomfortable for you. To do this: ? Put ice in a plastic bag. ? Place a towel between your skin and the bag. ? Leave the ice on for 20 minutes, 2-3 times a day.  Make sure that your baby is latched on and positioned properly   while breastfeeding.  If engorgement persists after 48 hours of following these recommendations, contact your health care provider or a Advertising copywriter. Overall health care recommendations while breastfeeding  Eat 3 healthy meals and 3 snacks every day. Well-nourished mothers who are breastfeeding need an additional 450-500 calories a day. You can meet this requirement by increasing the amount of a balanced diet that you eat.  Drink enough water to keep your urine pale yellow or clear.  Rest often, relax, and continue to take your prenatal vitamins to prevent fatigue, stress, and low vitamin and mineral levels in your body (nutrient deficiencies).  Do not use any products that contain nicotine or tobacco, such as cigarettes and e-cigarettes. Your baby may be harmed by chemicals from cigarettes that pass into breast milk and exposure to secondhand smoke. If you need help quitting, ask your health care provider.  Avoid alcohol.  Do not use illegal drugs or marijuana.  Talk with your health care provider before taking any medicines. These include over-the-counter and prescription medicines as well as vitamins and herbal supplements. Some medicines that may be harmful to your baby can pass through breast milk.  It is possible to become pregnant while breastfeeding. If  birth control is desired, ask your health care provider about options that will be safe while breastfeeding your baby. Where to find more information: [employer]: www.llli.org Contact a health care provider if:  You feel like you want to stop breastfeeding or have become frustrated with breastfeeding.  Your nipples are cracked or bleeding.  Your breasts are red, tender, or warm.  You have: ? Painful breasts or nipples. ? A swollen area on either breast. ? A fever or chills. ? Nausea or vomiting. ? Drainage other than breast milk from your nipples.  Your breasts do not become full before feedings by the fifth day after you give birth.  You feel sad and depressed.  Your baby is: ? Too sleepy to eat well. ? Having trouble sleeping. ? More than 6 week old and wetting fewer than 6 diapers in a 24-hour period. ? Not gaining weight by 41 days of age.  Your baby has fewer than 3 stools in a 24-hour period.  Your baby's skin or the white parts of his or her eyes become yellow. Get help right away if:  Your baby is overly tired (lethargic) and does not want to wake up and feed.  Your baby develops an unexplained fever. Summary  Breastfeeding offers many health benefits for infant and mothers.  Try to breastfeed your infant when he or she shows early signs of hunger.  Gently tickle or stroke your baby's lips with your finger or nipple to allow the baby to open his or her mouth. Bring the baby to your breast. Make sure that much of the areola is in your baby's mouth. Offer one side and burp the baby before you offer the other side.  Talk with your health care provider or lactation consultant if you have questions or you face problems as you breastfeed. This information is not intended to replace advice given to you by your health care provider. Make sure you discuss any questions you have with your health care provider. Document Released: 09/29/2005 Document  Revised: 10/31/2016 Document Reviewed: 10/31/2016 Elsevier Interactive Patient Education  2018 [employer].  First Trimester of Pregnancy The first trimester of pregnancy is from week 1 until the end of week 13 (months 1 through 3). During this time, your  baby will begin to develop inside you. At 6-8 weeks, the eyes and face are formed, and the heartbeat can be seen on ultrasound. At the end of 12 weeks, all the baby's organs are formed. Prenatal care is all the medical care you receive before the birth of your baby. Make sure you get good prenatal care and follow all of your doctor's instructions. Follow these instructions at home: Medicines  Take over-the-counter and prescription medicines only as told by your doctor. Some medicines are safe and some medicines are not safe during pregnancy.  Take a prenatal vitamin that contains at least 600 micrograms (mcg) of folic acid.  If you have trouble pooping (constipation), take medicine that will make your stool soft (stool softener) if your doctor approves. Eating and drinking  Eat regular, healthy meals.  Your doctor will tell you the amount of weight gain that is right for you.  Avoid raw meat and uncooked cheese.  If you feel sick to your stomach (nauseous) or throw up (vomit): ? Eat 4 or 5 small meals a day instead of 3 large meals. ? Try eating a few soda crackers. ? Drink liquids between meals instead of during meals.  To prevent constipation: ? Eat foods that are high in fiber, like fresh fruits and vegetables, whole grains, and beans. ? Drink enough fluids to keep your pee (urine) clear or pale yellow. Activity  Exercise only as told by your doctor. Stop exercising if you have cramps or pain in your lower belly (abdomen) or low back.  Do not exercise if it is too hot, too humid, or if you are in a place of great height (high altitude).  Try to avoid standing for long periods of time. Move your legs often if you must stand in  one place for a long time.  Avoid heavy lifting.  Wear low-heeled shoes. Sit and stand up straight.  You can have sex unless your doctor tells you not to. Relieving pain and discomfort  Wear a good support bra if your breasts are sore.  Take warm water baths (sitz baths) to soothe pain or discomfort caused by hemorrhoids. Use hemorrhoid cream if your doctor says it is okay.  Rest with your legs raised if you have leg cramps or low back pain.  If you have puffy, bulging veins (varicose veins) in your legs: ? Wear support hose or compression stockings as told by your doctor. ? Raise (elevate) your feet for 15 minutes, 3-4 times a day. ? Limit salt in your food. Prenatal care  Schedule your prenatal visits by the twelfth week of pregnancy.  Write down your questions. Take them to your prenatal visits.  Keep all your prenatal visits as told by your doctor. This is important. Safety  Wear your seat belt at all times when driving.  Make a list of emergency phone numbers. The list should include numbers for family, friends, the hospital, and police and fire departments. General instructions  Ask your doctor for a referral to a local prenatal class. Begin classes no later than at the start of month 6 of your pregnancy.  Ask for help if you need counseling or if you need help with nutrition. Your doctor can give you advice or tell you where to go for help.  Do not use hot tubs, steam rooms, or saunas.  Do not douche or use tampons or scented sanitary pads.  Do not cross your legs for long periods of time.  Avoid all herbs  and alcohol. Avoid drugs that are not approved by your doctor.  Do not use any tobacco products, including cigarettes, chewing tobacco, and electronic cigarettes. If you need help quitting, ask your doctor. You may get counseling or other support to help you quit.  Avoid cat litter boxes and soil used by cats. These carry germs that can cause birth defects in  the baby and can cause a loss of your baby (miscarriage) or stillbirth.  Visit your dentist. At home, brush your teeth with a soft toothbrush. Be gentle when you floss. Contact a doctor if:  You are dizzy.  You have mild cramps or pressure in your lower belly.  You have a nagging pain in your belly area.  You continue to feel sick to your stomach, you throw up, or you have watery poop (diarrhea).  You have a bad smelling fluid coming from your vagina.  You have pain when you pee (urinate).  You have increased puffiness (swelling) in your face, hands, legs, or ankles. Get help right away if:  You have a fever.  You are leaking fluid from your vagina.  You have spotting or bleeding from your vagina.  You have very bad belly cramping or pain.  You gain or lose weight rapidly.  You throw up blood. It may look like coffee grounds.  You are around people who have [place] measles, fifth disease, or chickenpox.  You have a very bad headache.  You have shortness of breath.  You have any kind of trauma, such as from a fall or a car accident. Summary  The first trimester of pregnancy is from week 1 until the end of week 13 (months 1 through 3).  To take care of yourself and your unborn baby, you will need to eat healthy meals, take medicines only if your doctor tells you to do so, and do activities that are safe for you and your baby.  Keep all follow-up visits as told by your doctor. This is important as your doctor will have to ensure that your baby is healthy and growing well. This information is not intended to replace advice given to you by your health care provider. Make sure you discuss any questions you have with your health care provider. Document Released: 03/17/2008 Document Revised: 10/07/2016 Document Reviewed: 10/07/2016 Elsevier Interactive Patient Education  2017 [employer]

## 2018-05-12 LAB — OBSTETRIC PANEL, INCLUDING HIV
Antibody Screen: NEGATIVE
Basophils Absolute: 0 10*3/uL (ref 0.0–0.2)
Basos: 0 %
EOS (ABSOLUTE): 0.4 10*3/uL (ref 0.0–0.4)
Eos: 5 %
HIV SCREEN 4TH GENERATION: NONREACTIVE
Hematocrit: 40.1 % (ref 34.0–46.6)
Hemoglobin: 13 g/dL (ref 11.1–15.9)
Hepatitis B Surface Ag: NEGATIVE
Immature Grans (Abs): 0 10*3/uL (ref 0.0–0.1)
Immature Granulocytes: 1 %
LYMPHS: 25 %
Lymphocytes Absolute: 2.2 10*3/uL (ref 0.7–3.1)
MCH: 27.1 pg (ref 26.6–33.0)
MCHC: 32.4 g/dL (ref 31.5–35.7)
MCV: 84 fL (ref 79–97)
MONOCYTES: 6 %
MONOS ABS: 0.5 10*3/uL (ref 0.1–0.9)
NEUTROS ABS: 5.6 10*3/uL (ref 1.4–7.0)
Neutrophils: 63 %
PLATELETS: 178 10*3/uL (ref 150–450)
RBC: 4.79 x10E6/uL (ref 3.77–5.28)
RDW: 15.8 % — AB (ref 12.3–15.4)
RPR Ser Ql: NONREACTIVE
RUBELLA: 1.55 {index} (ref >0.99)
Rh Factor: POSITIVE
WBC: 8.7 10*3/uL (ref 3.4–10.8)

## 2018-05-12 LAB — CYTOLOGY - PAP
Candida vaginitis: NEGATIVE
Chlamydia: NEGATIVE
Diagnosis: NEGATIVE
HPV: NOT DETECTED
NEISSERIA GONORRHEA: NEGATIVE
Trichomonas: NEGATIVE

## 2018-05-12 LAB — HEMOGLOBIN A1C
Est. average glucose Bld gHb Est-mCnc: 126 mg/dL
HEMOGLOBIN A1C: 6 % — AB (ref 4.8–5.6)

## 2018-05-13 ENCOUNTER — Telehealth: Payer: Self-pay | Admitting: Radiology

## 2018-05-13 LAB — CULTURE, OB URINE

## 2018-05-13 LAB — URINE CULTURE, OB REFLEX

## 2018-05-13 NOTE — Telephone Encounter (Signed)
Called and spoke with patient about scheduling an early 2 hour glucose testing lab per Dr Shawnie PonsPratt, Patient states that she will call back to schedule after she speaks with husband about having a ride. Explained that this is important that we complete this lab and to call back to schedule.

## 2018-05-17 ENCOUNTER — Telehealth: Payer: Self-pay

## 2018-05-17 NOTE — Telephone Encounter (Signed)
PA has been sent to pharmacy for Diclegis. They will email the office with a decision

## 2018-05-18 LAB — SMN1 COPY NUMBER ANALYSIS (SMA CARRIER SCREENING)

## 2018-05-18 LAB — HEMOGLOBINOPATHY EVALUATION
HEMOGLOBIN A2 QUANTITATION: 2 % (ref 1.8–3.2)
HEMOGLOBIN F QUANTITATION: 0 % (ref 0.0–2.0)
HGB C: 0 %
HGB S: 0 %
HGB VARIANT: 0 %
Hgb A: 98 % (ref 96.4–98.8)

## 2018-05-18 LAB — CYSTIC FIBROSIS MUTATION 97: GENE DIS ANAL CARRIER INTERP BLD/T-IMP: NOT DETECTED

## 2018-05-20 ENCOUNTER — Other Ambulatory Visit: Payer: Medicaid Other

## 2018-05-28 ENCOUNTER — Other Ambulatory Visit: Payer: Medicaid Other

## 2018-06-08 ENCOUNTER — Encounter: Payer: Self-pay | Admitting: Obstetrics and Gynecology

## 2018-06-08 ENCOUNTER — Ambulatory Visit (INDEPENDENT_AMBULATORY_CARE_PROVIDER_SITE_OTHER): Payer: Medicaid Other | Admitting: Obstetrics and Gynecology

## 2018-06-08 VITALS — BP 127/84 | HR 90 | Wt 267.6 lb

## 2018-06-08 DIAGNOSIS — O139 Gestational [pregnancy-induced] hypertension without significant proteinuria, unspecified trimester: Secondary | ICD-10-CM | POA: Insufficient documentation

## 2018-06-08 DIAGNOSIS — O0990 Supervision of high risk pregnancy, unspecified, unspecified trimester: Secondary | ICD-10-CM

## 2018-06-08 DIAGNOSIS — G4489 Other headache syndrome: Secondary | ICD-10-CM

## 2018-06-08 DIAGNOSIS — O0992 Supervision of high risk pregnancy, unspecified, second trimester: Secondary | ICD-10-CM

## 2018-06-08 DIAGNOSIS — O99810 Abnormal glucose complicating pregnancy: Secondary | ICD-10-CM | POA: Insufficient documentation

## 2018-06-08 DIAGNOSIS — O132 Gestational [pregnancy-induced] hypertension without significant proteinuria, second trimester: Secondary | ICD-10-CM

## 2018-06-08 DIAGNOSIS — Z6841 Body Mass Index (BMI) 40.0 and over, adult: Secondary | ICD-10-CM

## 2018-06-08 NOTE — Progress Notes (Signed)
Prenatal Visit Note Date: 06/08/2018 Clinic: [facility]  Subjective:  Suzanne Frederick is a 22 y.o. G1P0 at 5023w2d being seen today for ongoing prenatal care.  She is currently monitored for the following issues for this high-risk pregnancy and has Supervision of high risk pregnancy, antepartum; ADHD (attention deficit hyperactivity disorder); Depression; Menorrhagia; Headache; Treacher Collins syndrome; Obesity complicating peripregnancy, antepartum; Nausea and vomiting of pregnancy, antepartum; BMI 50.0-59.9, adult (HCC); Transient hypertension of pregnancy; and Abnormal glucose affecting pregnancy on their problem list.  Patient reports no complaints.    . Vag. Bleeding: None.  Movement: Present. Denies leaking of fluid.   The following portions of the patient's history were reviewed and updated as appropriate: allergies, current medications, past family history, past medical history, past social history, past surgical history and problem list. Problem list updated.  Objective:   Vitals:   06/08/18 1527  BP: 127/84  Pulse: 90  Weight: 267 lb 9.6 oz (121.4 kg)    Fetal Status: Fetal Heart Rate (bpm): 145   Movement: Present     General:  Alert, oriented and cooperative. Patient is in no acute distress.  Skin: Skin is warm and dry. No rash noted.   Cardiovascular: Normal heart rate noted  Respiratory: Normal respiratory effort, no problems with respiration noted  Abdomen: Soft, gravid, appropriate for gestational age. Pain/Pressure: Absent     Pelvic:  Cervical exam deferred        Extremities: Normal range of motion.  Edema: None  Mental Status: Normal mood and affect. Normal behavior. Normal judgment and thought content.   Urinalysis:      Assessment and Plan:  Pregnancy: G1P0 at 3623w2d  1. Supervision of high risk pregnancy, antepartum Routine care. D/w her to watch weight. Pt amenable to starting low dose asa. Takes qday propranolol for migraines which  may affect her BP. Anatomy u/s. Pt to come back for lab only visit GTT testing. Pt hasn't heard anything about GC or her sleep study. Will try and re set her up for this - US MFM OB DETAIL +14 WK; Future - AFP, Serum, Open Spina Bifida - Protein / creatinine ratio, urine  2. BMI 50.0-59.9, adult (HCC) - AFP, Serum, Open Spina Bifida - Protein / creatinine ratio, urine  Preterm labor symptoms and general obstetric precautions including but not limited to vaginal bleeding, contractions, leaking of fluid and fetal movement were reviewed in detail with the patient. Please refer to After Visit Summary for other counseling recommendations.  Return in about 3 weeks (around 06/29/2018).   [place] BingPickens, Laporche Martelle, MD

## 2018-06-09 LAB — PROTEIN / CREATININE RATIO, URINE
Creatinine, Urine: 158.5 mg/dL
PROTEIN/CREAT RATIO: 69 mg/g{creat} (ref 0–200)
Protein, Ur: 11 mg/dL

## 2018-06-10 LAB — AFP, SERUM, OPEN SPINA BIFIDA
AFP MoM: 1.53
AFP Value: 36.3 ng/mL
GEST. AGE ON COLLECTION DATE: 16.2 wk
MATERNAL AGE AT EDD: 22 a
OSBR Risk 1 IN: 2523
TEST RESULTS AFP: NEGATIVE
Weight: 267 [lb_av]

## 2018-06-16 ENCOUNTER — Encounter: Payer: Self-pay | Admitting: Radiology

## 2018-06-17 ENCOUNTER — Other Ambulatory Visit: Payer: Medicaid Other

## 2018-06-18 ENCOUNTER — Other Ambulatory Visit: Payer: Medicaid Other

## 2018-06-21 ENCOUNTER — Encounter (HOSPITAL_COMMUNITY): Payer: Self-pay

## 2018-06-28 ENCOUNTER — Encounter: Payer: Medicaid Other | Admitting: Obstetrics & Gynecology

## 2018-06-30 ENCOUNTER — Ambulatory Visit (HOSPITAL_COMMUNITY)
Admission: RE | Admit: 2018-06-30 | Discharge: 2018-06-30 | Disposition: A | Discharge status: Home / Self Care | Payer: Medicaid Other | Source: Ambulatory Visit | Attending: Obstetrics and Gynecology | Admitting: Obstetrics and Gynecology

## 2018-06-30 ENCOUNTER — Encounter (HOSPITAL_COMMUNITY): Payer: Self-pay

## 2018-06-30 ENCOUNTER — Encounter: Payer: Medicaid Other | Admitting: Advanced Practice Midwife

## 2018-06-30 DIAGNOSIS — Z3A18 18 weeks gestation of pregnancy: Secondary | ICD-10-CM | POA: Diagnosis not present

## 2018-06-30 DIAGNOSIS — O0992 Supervision of high risk pregnancy, unspecified, second trimester: Secondary | ICD-10-CM | POA: Diagnosis not present

## 2018-06-30 DIAGNOSIS — Z148 Genetic carrier of other disease: Secondary | ICD-10-CM | POA: Insufficient documentation

## 2018-06-30 DIAGNOSIS — Z3689 Encounter for other specified antenatal screening: Secondary | ICD-10-CM | POA: Insufficient documentation

## 2018-06-30 DIAGNOSIS — O0990 Supervision of high risk pregnancy, unspecified, unspecified trimester: Secondary | ICD-10-CM

## 2018-06-30 DIAGNOSIS — O139 Gestational [pregnancy-induced] hypertension without significant proteinuria, unspecified trimester: Secondary | ICD-10-CM | POA: Diagnosis not present

## 2018-06-30 DIAGNOSIS — O99212 Obesity complicating pregnancy, second trimester: Secondary | ICD-10-CM | POA: Diagnosis not present

## 2018-06-30 DIAGNOSIS — Z3A19 19 weeks gestation of pregnancy: Secondary | ICD-10-CM | POA: Insufficient documentation

## 2018-06-30 IMAGING — US US MFM OB DETAIL+14 WK
1 series · 14 of 28 positions shown · non-contrast
Comparison: none

[Series 1: us mfm ob detail+14 wk · 14 of 97 slices shown]
[im 4/97]
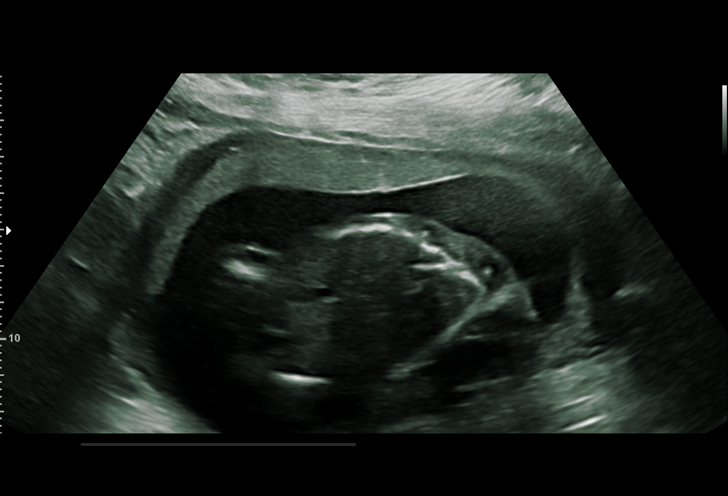
[im 11/97]
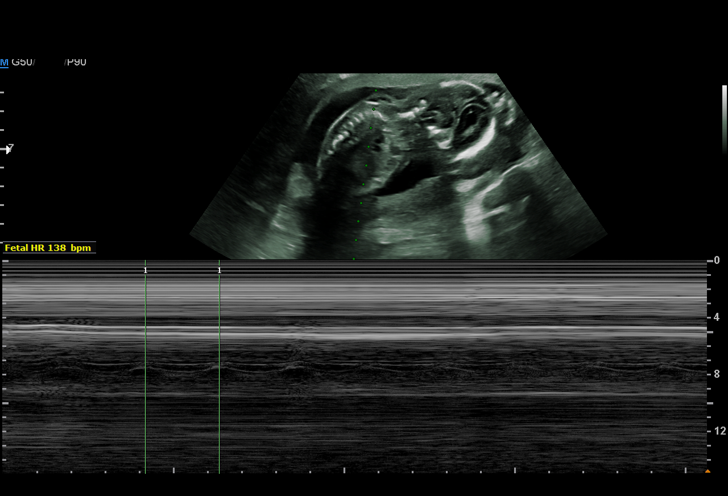
[im 18/97]
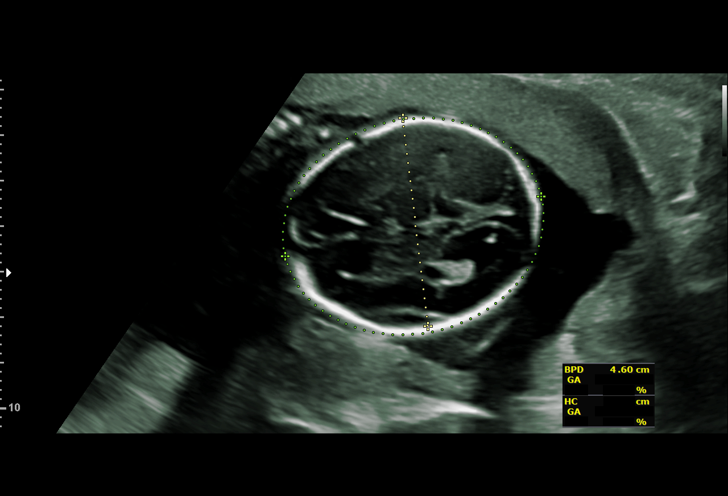
[im 25/97]
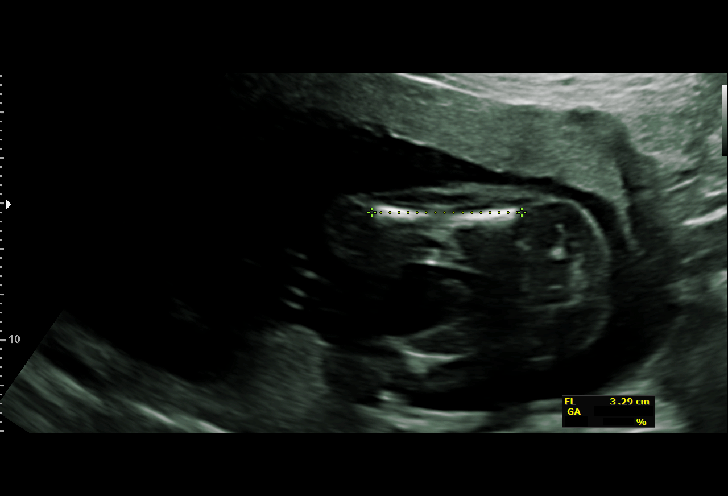
[im 33/97]
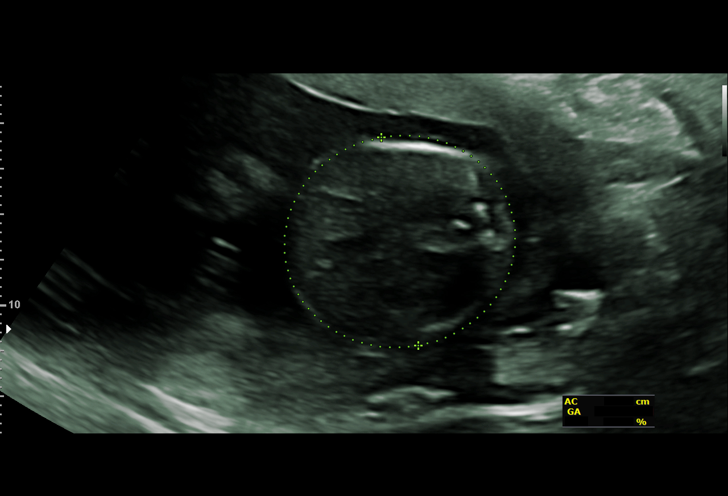
[im 40/97]
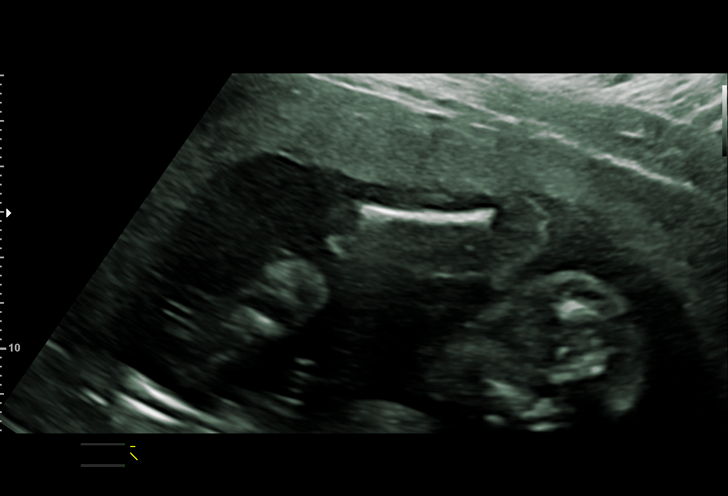
[im 47/97]
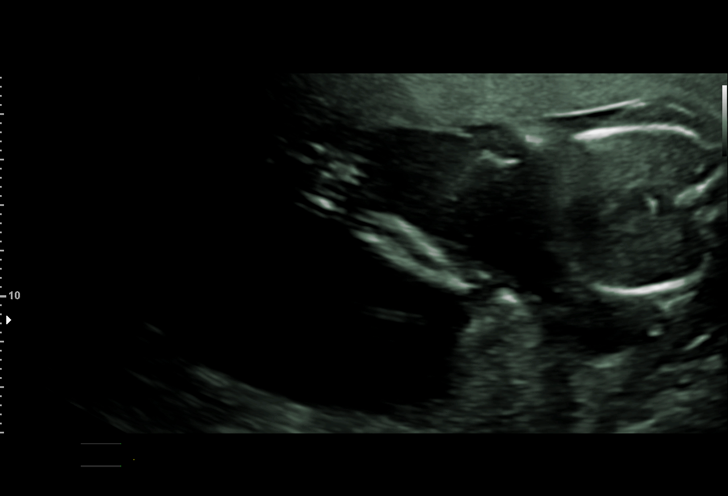
[im 54/97]
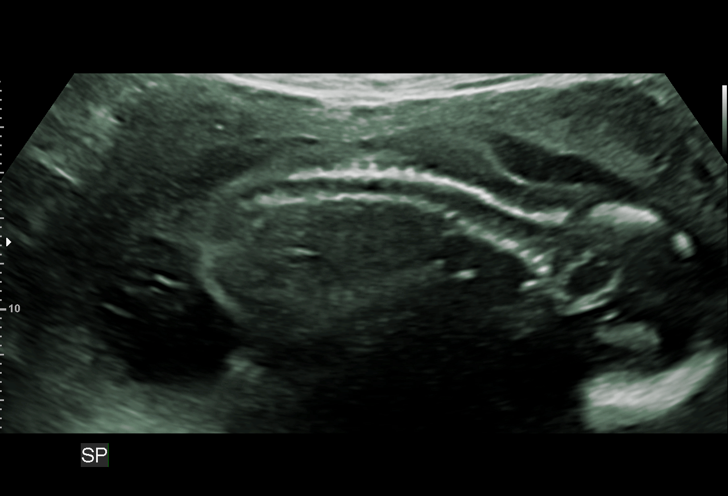
[im 61/97]
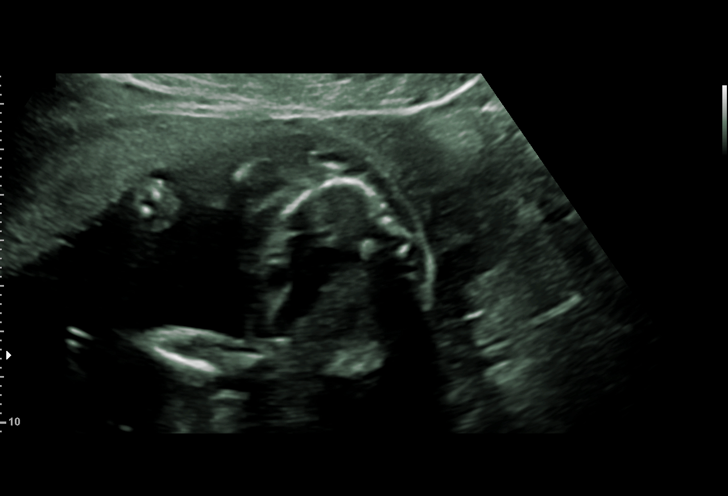
[im 68/97]
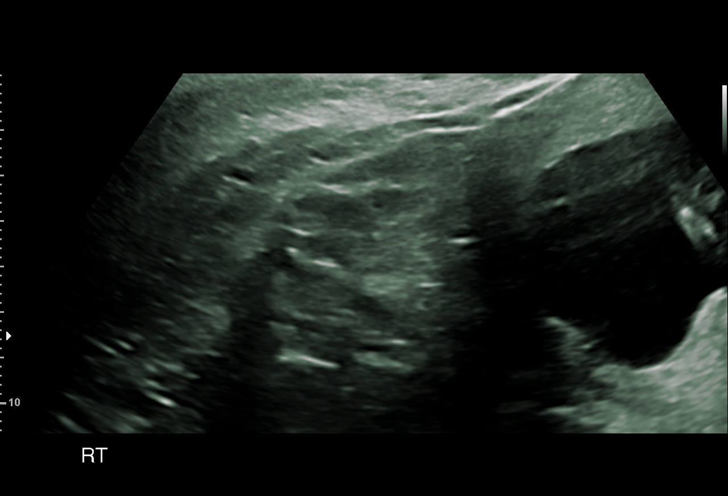
[im 75/97]
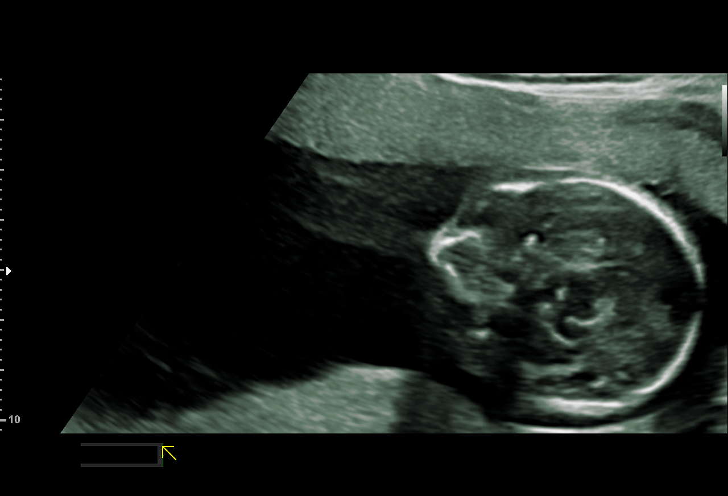
[im 82/97]
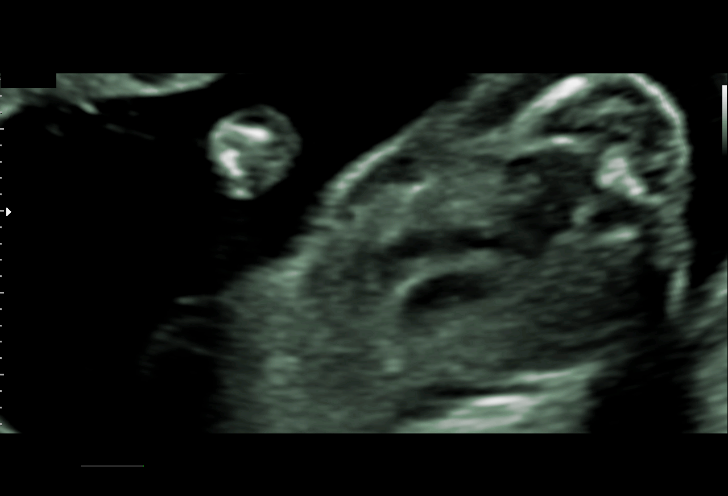
[im 89/97]
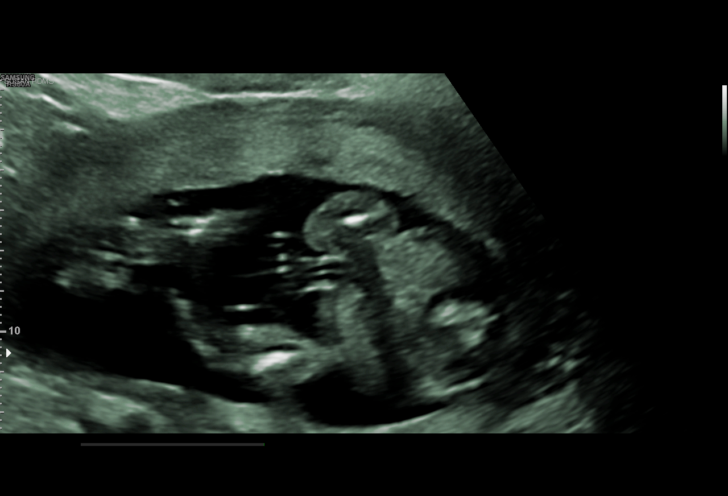
[im 97/97]
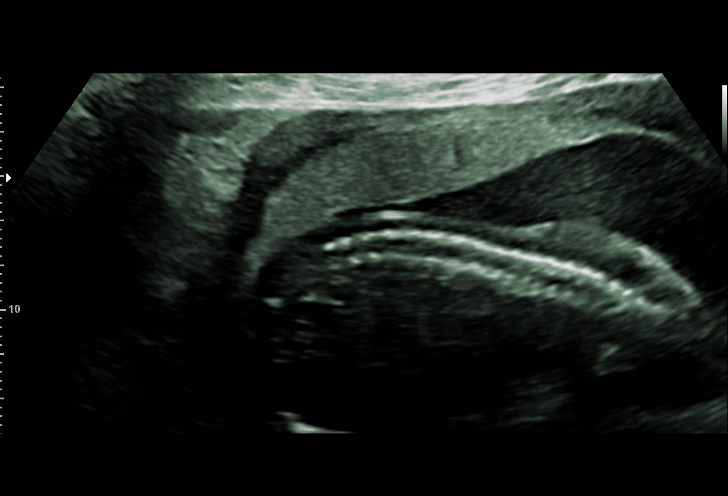

[14 of 28 positions shown; findings below may reference images not displayed]

Indications

Genetic carrier (specify) Pt withTreacher      [AV]
JEANNE Syndrome
Hypertension - Gestational vs CHTN             [AV]
Maternal morbid obesity                        [AV] [AV]
Low risk NIPS
19 weeks gestation of pregnancy
Fetal Evaluation

Num Of Fetuses:         1
Fetal Heart Rate(bpm):  138
Cardiac Activity:       Observed
Presentation:           Cephalic
Placenta:               Anterior
P. Cord Insertion:      Visualized, central

Amniotic Fluid
AFI FV:      Within normal limits

Largest Pocket(cm)
6.5
Biometry

BPD:        46  mm     G. Age:  19w 6d         71  %    CI:        76.44   %    70 - 86
FL/HC:      19.8   %    16.1 -
HC:      166.7  mm     G. Age:  19w 3d         39  %    HC/AC:      1.07        1.09 -
AC:      155.4  mm     G. Age:  20w 5d         83  %    FL/BPD:     71.7   %
FL:         33  mm     G. Age:  20w 2d         74  %    FL/AC:      21.2   %    20 - 24
HUM:      29.4  mm     G. Age:  19w 4d         56  %
CER:      19.2  mm     G. Age:  18w 4d         29  %
NFT:       2.7  mm
LV:        7.4  mm
CM:        4.4  mm

Est. FW:     351  gm    0 lb 12 oz      60  %
OB History

Gravidity:    1
Gestational Age

Clinical EDD:  19w 3d                                        EDD:   [DATE]
U/S Today:     20w 1d                                        EDD:   [DATE]
Best:          19w 3d     Det. By:  Clinical EDD             EDD:   [DATE]
Anatomy

Cranium:               Appears normal         LVOT:                   Appears normal
Cavum:                 Appears normal         Aortic Arch:            Not well visualized
Ventricles:            Appears normal         Ductal Arch:            Not well visualized
Choroid Plexus:        Appears normal         Diaphragm:              Appears normal
Cerebellum:            Appears normal         Stomach:                Appears normal, left
sided
Posterior Fossa:       Appears normal         Abdomen:                Appears normal
Nuchal Fold:           Appears normal         Abdominal Wall:         Appears nml (cord
insert, abd wall)
Face:                  Appears normal         Cord Vessels:           Appears normal (3
(orbits and profile)                           vessel cord)
Lips:                  Appears normal         Kidneys:                Appear normal
Palate:                Not well visualized    Bladder:                Appears normal
Thoracic:              Appears normal         Spine:                  Appears normal
Heart:                 Appears normal         Upper Extremities:      Appears normal
(4CH, axis, and
situs)
RVOT:                  Appears normal         Lower Extremities:      Appears normal

Other:  Nasal bone visualized.
Cervix Uterus Adnexa

Cervix
Length:            3.9  cm.
Normal appearance by transabdominal scan.

Left Ovary
Within normal limits.

Right Ovary
Not visualized.
Comments

U/S images reviewed. Findings reviewed with patient.
Appropriate fetal growth is noted.  No fetal abnormalities are
seen.
Questions answered.
10 minutes spent face to face with patient.
Recommendations: 1) Serial U/S every 4 weeks for fetal
growth 2) Weekly BPP beginning @ 36 weeks (BMI - 54.49)
Recommendations

1) Serial U/S every 4 weeks for fetal growth 2) Weekly BPP
beginning @ 36 weeks (BMI - 54.49)

## 2018-06-30 NOTE — ED Notes (Signed)
Patient in session with Genetic Counselor, Melanie Moshier. 

## 2018-07-01 LAB — HEMOGLOBINOPATHY EVALUATION
HGB A2 QUANT: 1.8 % (ref 1.8–3.2)
Hgb A: 98.2 % (ref 96.4–98.8)
Hgb C: 0 %
Hgb F Quant: 0 % (ref 0.0–2.0)
Hgb S Quant: 0 %
Hgb Variant: 0 %

## 2018-07-05 ENCOUNTER — Encounter (HOSPITAL_COMMUNITY): Payer: Self-pay

## 2018-07-08 ENCOUNTER — Encounter: Payer: Self-pay | Admitting: *Deleted

## 2018-07-08 ENCOUNTER — Other Ambulatory Visit (HOSPITAL_COMMUNITY): Payer: Self-pay

## 2018-07-12 ENCOUNTER — Ambulatory Visit (INDEPENDENT_AMBULATORY_CARE_PROVIDER_SITE_OTHER): Payer: Medicaid Other | Admitting: Obstetrics & Gynecology

## 2018-07-12 ENCOUNTER — Other Ambulatory Visit (HOSPITAL_COMMUNITY)
Admission: RE | Admit: 2018-07-12 | Discharge: 2018-07-12 | Disposition: A | Discharge status: Home / Self Care | Payer: Medicaid Other | Source: Ambulatory Visit | Attending: Obstetrics & Gynecology | Admitting: Obstetrics & Gynecology

## 2018-07-12 VITALS — BP 110/74 | HR 111 | Wt 272.0 lb

## 2018-07-12 DIAGNOSIS — N760 Acute vaginitis: Secondary | ICD-10-CM | POA: Insufficient documentation

## 2018-07-12 DIAGNOSIS — B9689 Other specified bacterial agents as the cause of diseases classified elsewhere: Secondary | ICD-10-CM | POA: Diagnosis not present

## 2018-07-12 DIAGNOSIS — N898 Other specified noninflammatory disorders of vagina: Secondary | ICD-10-CM | POA: Diagnosis present

## 2018-07-12 DIAGNOSIS — O0990 Supervision of high risk pregnancy, unspecified, unspecified trimester: Secondary | ICD-10-CM

## 2018-07-12 DIAGNOSIS — Z3491 Encounter for supervision of normal pregnancy, unspecified, first trimester: Secondary | ICD-10-CM

## 2018-07-12 DIAGNOSIS — Z6841 Body Mass Index (BMI) 40.0 and over, adult: Secondary | ICD-10-CM

## 2018-07-12 MED ORDER — PRENATAL VITAMIN 27-0.8 MG PO TABS: 1.0000 | ORAL_TABLET | Freq: Every day | ORAL | 3 refills | Status: DC

## 2018-07-12 MED ORDER — PRENATAL VITAMIN 27-0.8 MG PO TABS: 1.0000 | ORAL_TABLET | Freq: Every day | ORAL | 3 refills | Status: AC

## 2018-07-12 MED ORDER — METRONIDAZOLE 500 MG PO TABS: 500.0000 mg | ORAL_TABLET | Freq: Two times a day (BID) | ORAL | 0 refills | Status: DC

## 2018-07-12 NOTE — Progress Notes (Signed)
Pt states she is having an odorous discharge for past few weeks.   Scheryl Marten, RN

## 2018-07-12 NOTE — Progress Notes (Signed)
   PRENATAL VISIT NOTE  Subjective:  Suzanne Frederick is a 22 y.o. G1P0 at [redacted]w[redacted]d being seen today for ongoing prenatal care.  She is currently monitored for the following issues for this high-risk pregnancy and has Supervision of high risk pregnancy, antepartum; ADHD (attention deficit hyperactivity disorder); Depression; Menorrhagia; Headache; Treacher Collins syndrome; Obesity complicating peripregnancy, antepartum; Nausea and vomiting of pregnancy, antepartum; BMI 50.0-59.9, adult (HCC); Transient hypertension of pregnancy; and Abnormal glucose affecting pregnancy on their problem list.  Patient reports no complaints except for a 2 week h/o vaginal odor. She has a h/o BV. Contractions: Not present. Vag. Bleeding: None.  Movement: Present. Denies leaking of fluid.   The following portions of the patient's history were reviewed and updated as appropriate: allergies, current medications, past family history, past medical history, past social history, past surgical history and problem list. Problem list updated.  Objective:   Vitals:   07/12/18 1617  BP: 110/74  Pulse: (!) 111  Weight: 272 lb (123.4 kg)    Fetal Status: Fetal Heart Rate (bpm): 145   Movement: Present     General:  Alert, oriented and cooperative. Patient is in no acute distress.  Skin: Skin is warm and dry. No rash noted.   Cardiovascular: Normal heart rate noted  Respiratory: Normal respiratory effort, no problems with respiration noted  Abdomen: Soft, gravid, appropriate for gestational age.  Pain/Pressure: Present     Pelvic: Cervical exam performed        Extremities: Normal range of motion.  Edema: Trace  Mental Status: Normal mood and affect. Normal behavior. Normal judgment and thought content.   Assessment and Plan:  Pregnancy: G1P0 at [redacted]w[redacted]d  1. BMI 50.0-59.9, adult (HCC)  - [place] MFM OB FOLLOW UP; Future  2. Supervision of high risk pregnancy, antepartum -She was scheduled for a 2 hour GTT due to abnormally  high hba1c of 6. She couldn't get a ride, so she missed that appt. I have stressed that this is important.  She tells me that she cannot find a way to get here for that test. We also discussed that she has gained 22 pounds already, and the risks associated with this. I printed out ACOG guidelines for her.   Preterm labor symptoms and general obstetric precautions including but not limited to vaginal bleeding, contractions, leaking of fluid and fetal movement were reviewed in detail with the patient. Please refer to After Visit Summary for other counseling recommendations.  Return in about 4 weeks (around 08/09/2018).  No future appointments.  Allie Bossier, MD

## 2018-07-14 LAB — CERVICOVAGINAL ANCILLARY ONLY
BACTERIAL VAGINITIS: POSITIVE — AB
CANDIDA VAGINITIS: NEGATIVE
TRICH (WINDOWPATH): NEGATIVE

## 2018-07-15 ENCOUNTER — Other Ambulatory Visit: Payer: Self-pay | Admitting: Obstetrics & Gynecology

## 2018-07-15 MED ORDER — METRONIDAZOLE 500 MG PO TABS: 500.0000 mg | ORAL_TABLET | Freq: Two times a day (BID) | ORAL | 0 refills | Status: DC

## 2018-07-15 NOTE — Progress Notes (Signed)
Flagyl prescribed for BV 

## 2018-07-20 ENCOUNTER — Other Ambulatory Visit: Payer: Medicaid Other

## 2018-07-29 ENCOUNTER — Encounter (HOSPITAL_COMMUNITY): Payer: Self-pay

## 2018-07-29 ENCOUNTER — Ambulatory Visit (HOSPITAL_COMMUNITY)
Admission: RE | Admit: 2018-07-29 | Discharge: 2018-07-29 | Disposition: A | Discharge status: Home / Self Care | Payer: Medicaid Other | Source: Ambulatory Visit | Attending: Obstetrics & Gynecology | Admitting: Obstetrics & Gynecology

## 2018-07-29 DIAGNOSIS — O139 Gestational [pregnancy-induced] hypertension without significant proteinuria, unspecified trimester: Secondary | ICD-10-CM

## 2018-07-29 DIAGNOSIS — Z3A23 23 weeks gestation of pregnancy: Secondary | ICD-10-CM

## 2018-07-29 DIAGNOSIS — Z6841 Body Mass Index (BMI) 40.0 and over, adult: Secondary | ICD-10-CM

## 2018-07-29 DIAGNOSIS — O99210 Obesity complicating pregnancy, unspecified trimester: Secondary | ICD-10-CM

## 2018-07-29 DIAGNOSIS — O99212 Obesity complicating pregnancy, second trimester: Secondary | ICD-10-CM

## 2018-07-29 DIAGNOSIS — O0990 Supervision of high risk pregnancy, unspecified, unspecified trimester: Secondary | ICD-10-CM

## 2018-07-29 IMAGING — US US MFM OB FOLLOW-UP
1 series · 14 of 25 positions shown · non-contrast
Comparison: none

[Series 1: us mfm ob follow-up · 14 of 25 slices shown]
[im 1/25]
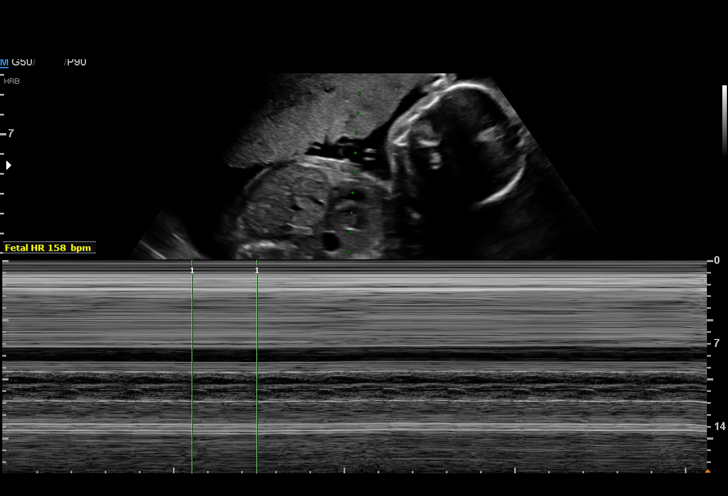
[im 3/25]
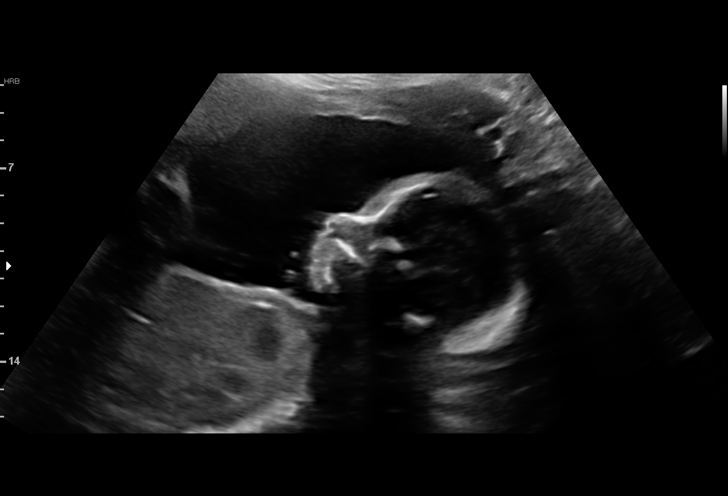
[im 5/25]
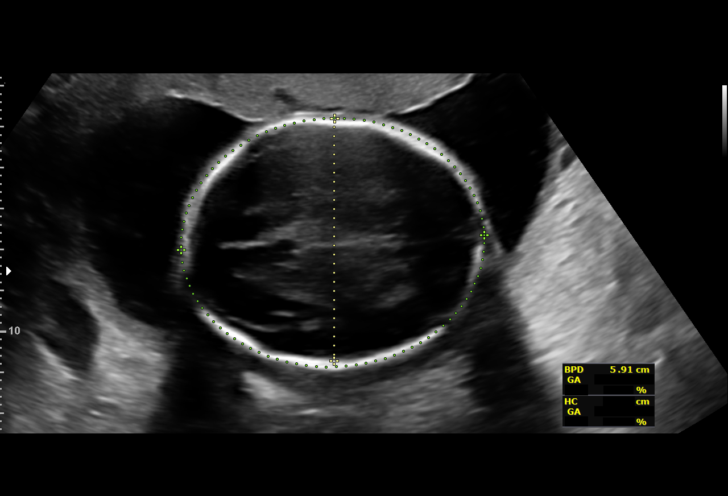
[im 7/25]
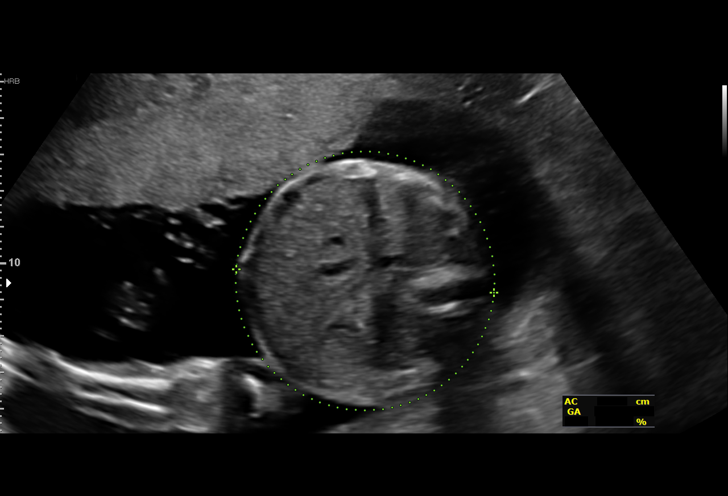
[im 9/25]
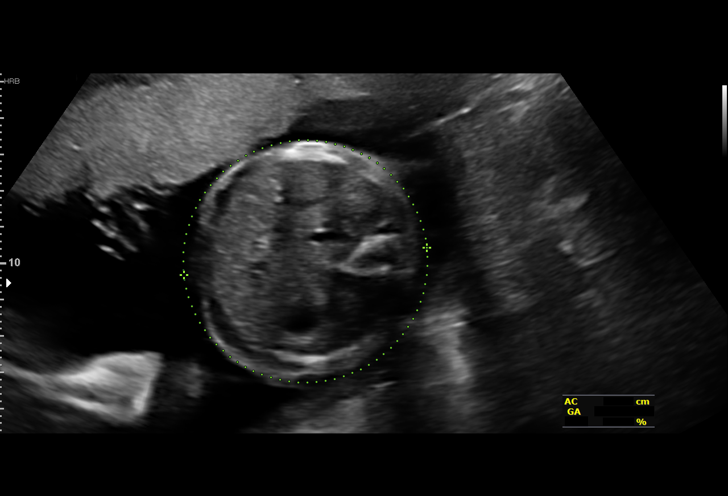
[im 10/25]
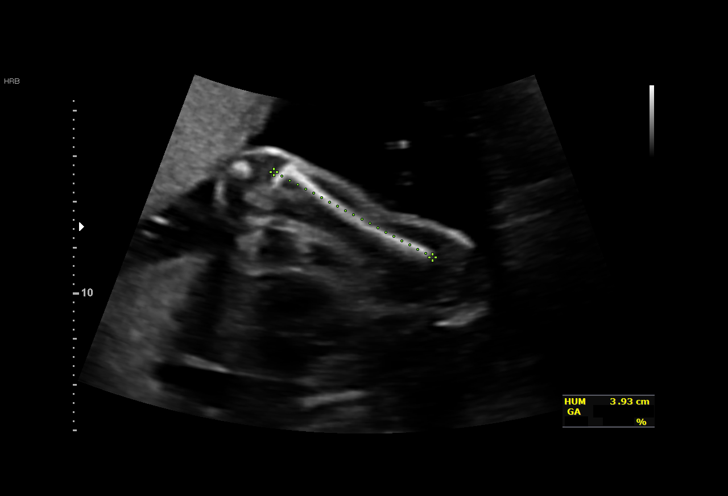
[im 12/25]
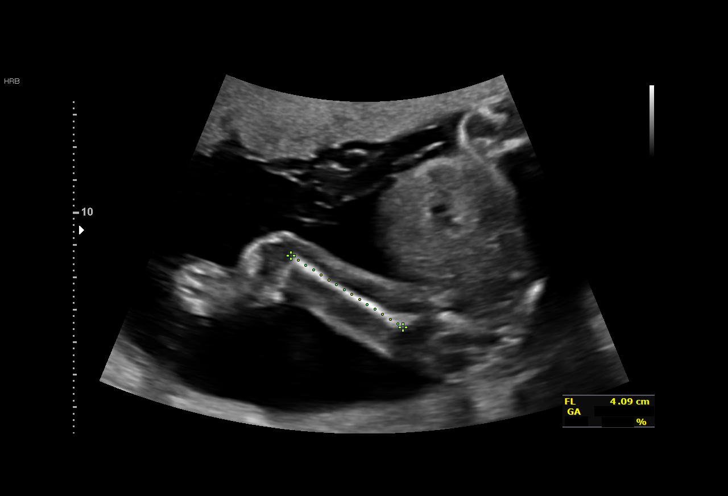
[im 14/25]
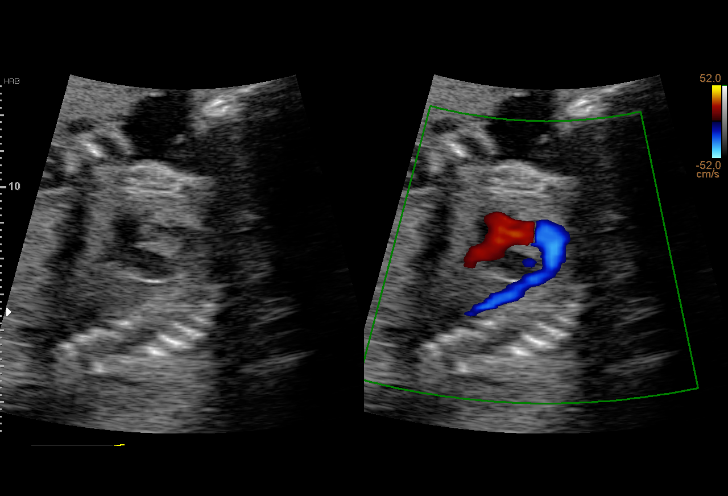
[im 16/25]
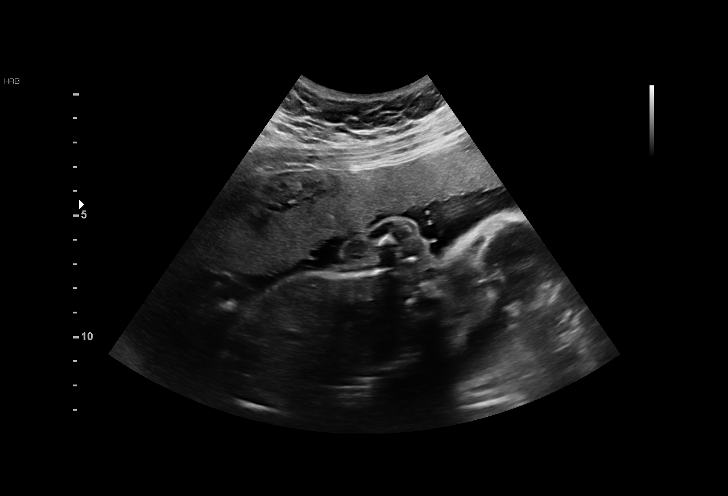
[im 17/25]
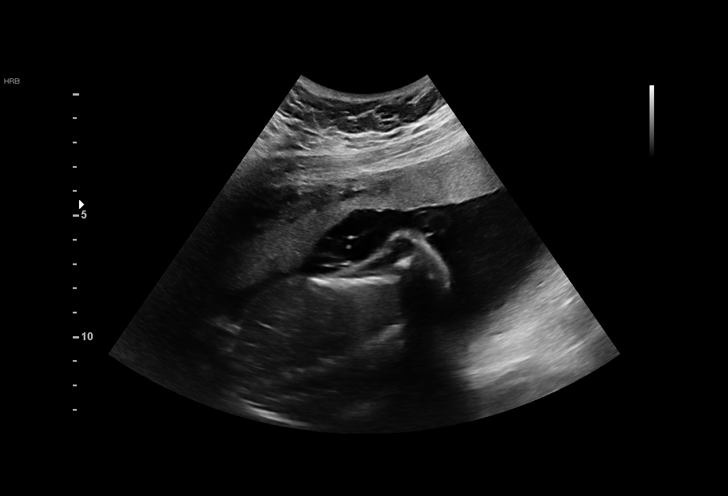
[im 19/25]
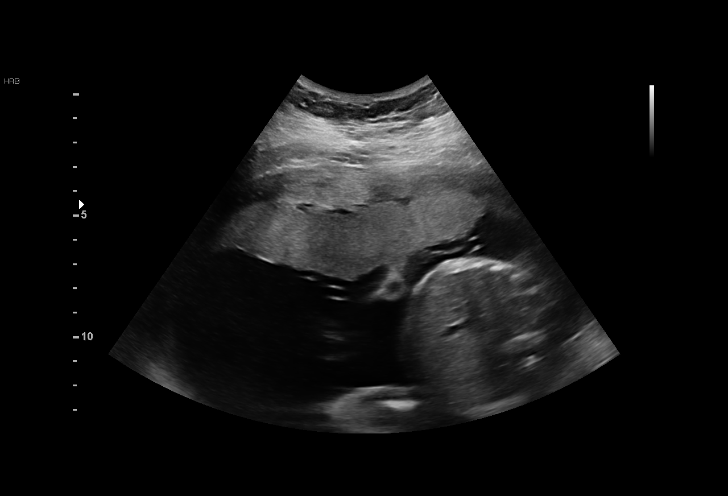
[im 21/25]
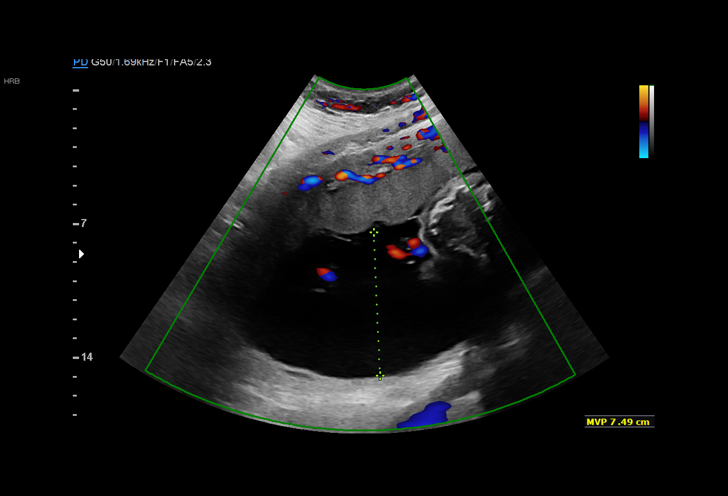
[im 23/25]
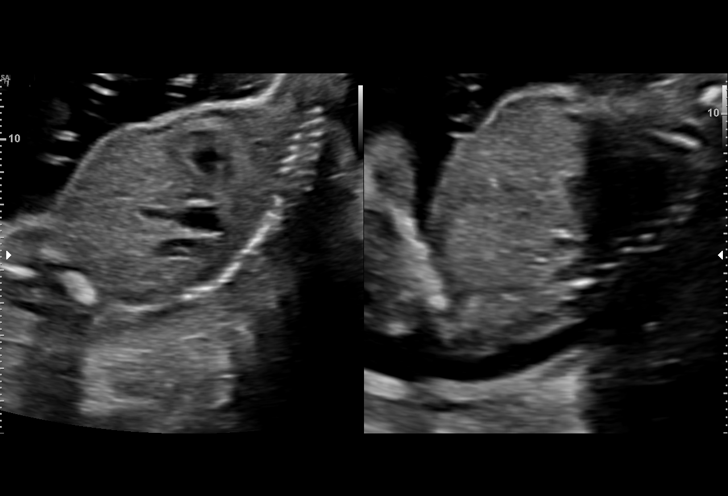
[im 25/25]
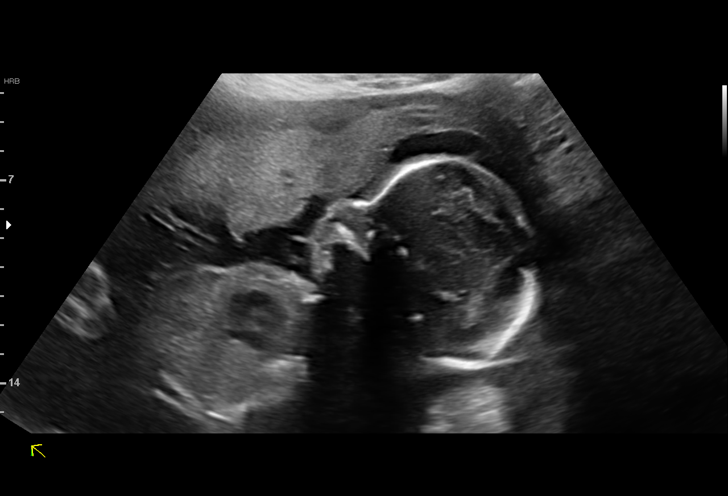

[14 of 25 positions shown; findings below may reference images not displayed]

Indications

Genetic carrier (specify) Pt withTreacher      [61]
ULTIMAT SPORT Syndrome
Hypertension - Gestational vs CHTN             [61]
Maternal morbid obesity                        [61] [61]
Low risk NIPS
23 weeks gestation of pregnancy
Vital Signs

BMI:
Fetal Evaluation

Num Of Fetuses:         1
Fetal Heart Rate(bpm):  158
Cardiac Activity:       Observed
Presentation:           Cephalic
Placenta:               Anterior
P. Cord Insertion:      Previously Visualized

Amniotic Fluid
AFI FV:      Within normal limits

Largest Pocket(cm)
7.49
Biometry

BPD:        59  mm     G. Age:  24w 1d         65  %    CI:        76.19   %    70 - 86
FL/HC:      19.5   %    18.7 -
HC:      214.2  mm     G. Age:  23w 4d         30  %    HC/AC:      0.99        1.05 -
AC:      215.9  mm     G. Age:  26w 1d         97  %    FL/BPD:     70.7   %    71 - 87
FL:       41.7  mm     G. Age:  23w 4d         38  %    FL/AC:      19.3   %    20 - 24
HUM:      39.3  mm     G. Age:  24w 0d         52  %

Est. FW:     738  gm    1 lb 10 oz      74  %
OB History

Gravidity:    1
Gestational Age

Clinical EDD:  23w 4d                                        EDD:   [DATE]
U/S Today:     24w 3d                                        EDD:   [DATE]
Best:          23w 4d     Det. By:  Clinical EDD             EDD:   [DATE]
Anatomy

Cranium:               Appears normal         Aortic Arch:            Not well visualized
Cavum:                 Appears normal         Ductal Arch:            Not well visualized
Ventricles:            Previously seen        Diaphragm:              Previously seen
Choroid Plexus:        Previously seen        Stomach:                Appears normal, left
sided
Cerebellum:            Previously seen        Abdomen:                Appears normal
Posterior Fossa:       Previously seen        Abdominal Wall:         Previously seen
Nuchal Fold:           Previously seen        Cord Vessels:           Previously seen
Face:                  Orbits and profile     Kidneys:                Appear normal
previously seen
Lips:                  Previously seen        Bladder:                Appears normal
Thoracic:              Appears normal         Spine:                  Previously seen
Heart:                 Previously seen        Upper Extremities:      Previously seen
RVOT:                  Previously seen        Lower Extremities:      Previously seen
LVOT:                  Previously seen

Other:  Nasal bone visualized.
Cervix Uterus Adnexa

Cervix
Not visualized (advanced GA >[61])
Impression

Chronic hypertension. Well-controlled without
antihypertensives.

Amniotic fluid is normal and good fetal activity is seen. Fetal
growth is appropriate for gestational age.
Recommendations

An appointment was made for her to return in 4 weeks for
fetal growth assessment and to evaluate the arches if
possible.

## 2018-07-30 ENCOUNTER — Other Ambulatory Visit (HOSPITAL_COMMUNITY): Payer: Self-pay | Admitting: *Deleted

## 2018-07-30 DIAGNOSIS — O10919 Unspecified pre-existing hypertension complicating pregnancy, unspecified trimester: Secondary | ICD-10-CM

## 2018-08-05 ENCOUNTER — Encounter (HOSPITAL_COMMUNITY): Payer: Self-pay

## 2018-08-05 ENCOUNTER — Inpatient Hospital Stay (HOSPITAL_COMMUNITY)
Admission: AD | Admit: 2018-08-05 | Discharge: 2018-08-05 | Disposition: A | Discharge status: Home / Self Care | Payer: Medicaid Other | Source: Ambulatory Visit | Attending: Obstetrics and Gynecology | Admitting: Obstetrics and Gynecology

## 2018-08-05 DIAGNOSIS — Z7982 Long term (current) use of aspirin: Secondary | ICD-10-CM | POA: Insufficient documentation

## 2018-08-05 DIAGNOSIS — O99332 Smoking (tobacco) complicating pregnancy, second trimester: Secondary | ICD-10-CM | POA: Diagnosis not present

## 2018-08-05 DIAGNOSIS — K5900 Constipation, unspecified: Secondary | ICD-10-CM

## 2018-08-05 DIAGNOSIS — Z3A24 24 weeks gestation of pregnancy: Secondary | ICD-10-CM | POA: Diagnosis not present

## 2018-08-05 DIAGNOSIS — R1030 Lower abdominal pain, unspecified: Secondary | ICD-10-CM | POA: Diagnosis present

## 2018-08-05 DIAGNOSIS — N9489 Other specified conditions associated with female genital organs and menstrual cycle: Secondary | ICD-10-CM

## 2018-08-05 DIAGNOSIS — F17200 Nicotine dependence, unspecified, uncomplicated: Secondary | ICD-10-CM | POA: Diagnosis not present

## 2018-08-05 DIAGNOSIS — E860 Dehydration: Secondary | ICD-10-CM | POA: Insufficient documentation

## 2018-08-05 DIAGNOSIS — O26892 Other specified pregnancy related conditions, second trimester: Secondary | ICD-10-CM | POA: Diagnosis not present

## 2018-08-05 DIAGNOSIS — R109 Unspecified abdominal pain: Secondary | ICD-10-CM

## 2018-08-05 HISTORY — DX: Migraine, unspecified, not intractable, without status migrainosus: G43.909

## 2018-08-05 LAB — FETAL FIBRONECTIN: Fetal Fibronectin: NEGATIVE

## 2018-08-05 LAB — URINALYSIS, ROUTINE W REFLEX MICROSCOPIC
Bilirubin Urine: NEGATIVE
GLUCOSE, UA: NEGATIVE mg/dL
Hgb urine dipstick: NEGATIVE
KETONES UR: 20 mg/dL — AB
Leukocytes, UA: NEGATIVE
Nitrite: NEGATIVE
PROTEIN: NEGATIVE mg/dL
Specific Gravity, Urine: 1.023 (ref 1.005–1.030)
pH: 5 (ref 5.0–8.0)

## 2018-08-05 LAB — CBC WITH DIFFERENTIAL/PLATELET
Basophils Absolute: 0 10*3/uL (ref 0.0–0.1)
Basophils Relative: 0 %
EOS ABS: 0.2 10*3/uL (ref 0.0–0.5)
EOS PCT: 2 %
HCT: 35.9 % — ABNORMAL LOW (ref 36.0–46.0)
Hemoglobin: 11.6 g/dL — ABNORMAL LOW (ref 12.0–15.0)
LYMPHS ABS: 1.9 10*3/uL (ref 0.7–4.0)
LYMPHS PCT: 18 %
MCH: 28.5 pg (ref 26.0–34.0)
MCHC: 32.3 g/dL (ref 30.0–36.0)
MCV: 88.2 fL (ref 80.0–100.0)
MONO ABS: 0.6 10*3/uL (ref 0.1–1.0)
Monocytes Relative: 6 %
Neutro Abs: 8.2 10*3/uL — ABNORMAL HIGH (ref 1.7–7.7)
Neutrophils Relative %: 74 %
PLATELETS: 160 10*3/uL (ref 150–400)
RBC: 4.07 MIL/uL (ref 3.87–5.11)
RDW: 16.3 % — AB (ref 11.5–15.5)
WBC: 11 10*3/uL — ABNORMAL HIGH (ref 4.0–10.5)
nRBC: 0 % (ref 0.0–0.2)

## 2018-08-05 NOTE — MAU Provider Note (Addendum)
Chief Complaint:  Abdominal Pain   First Provider Initiated Contact with Patient 08/05/18 2035     HPI: Suzanne Frederick is a 22 y.o. G1P0 at 42w4dwho presents to maternity admissions reporting lower abdominal cramping since last night.  Has had some constipation but did have BM today.  . She reports good fetal movement, denies LOF, vaginal bleeding, vaginal itching/burning, urinary symptoms, h/a, dizziness, n/v, diarrhea, constipation or fever/chills.  She denies headache, visual changes or RUQ abdominal pain.  Abdominal Pain  This is a new problem. The current episode started yesterday. The onset quality is gradual. The problem occurs intermittently. The problem has been unchanged. The quality of the pain is cramping. The abdominal pain does not radiate. Associated symptoms include constipation. Pertinent negatives include no diarrhea, dysuria, fever, frequency, myalgias, nausea or vomiting. Nothing aggravates the pain. The pain is relieved by nothing. She has tried nothing for the symptoms.   RN note: Pt reports lower abdominal cramping that started last night that was every couple of minutes. Has continued to have cramping, although patient reports it is not as frequent. Rates 5/10. Pt denies vaginal bleeding or LOF. Reports good fetal movement. Denies urinary s/s. Pt reports constipation-last BM today.    Past Medical History: Past Medical History:  Diagnosis Date  . Migraines   . Treacher Collins syndrome     Past obstetric history: OB History  Gravida Para Term Preterm AB Living  1         0  SAB TAB Ectopic Multiple Live Births               # Outcome Date GA Lbr Len/2nd Weight Sex Delivery Anes PTL Lv  1 Current             Past Surgical History: Past Surgical History:  Procedure Laterality Date  . TYMPANOSTOMY      Family History: Family History  Problem Relation Age of Onset  . Treacher Collins syndrome Sister   . ADD / ADHD Sister   . Diabetes Mother   .  Hypertension Mother   . Hypertension Father   . Hypertension Paternal Uncle   . Hypertension Maternal Grandfather   . Hypertension Paternal Grandmother   . Hyperlipidemia Paternal Grandmother   . Hypertension Paternal Grandfather   . Hyperlipidemia Paternal Grandfather   . Cancer Maternal Uncle     Social History: Social History   Tobacco Use  . Smoking status: Current Some Day Smoker    Last attempt to quit: 03/11/2018    Years since quitting: 0.4  . Smokeless tobacco: Never Used  Substance Use Topics  . Alcohol use: No  . Drug use: Never    Allergies: No Known Allergies  Meds:  Medications Prior to Admission  Medication Sig Dispense Refill Last Dose  . aspirin 81 MG chewable tablet Chew 1 tablet (81 mg total) by mouth daily. 90 tablet 3 Past Week at Unknown time  . Prenatal Vit-Fe Fumarate-FA (PRENATAL VITAMIN) 27-0.8 MG TABS Take 1 tablet by mouth daily. 90 tablet 3 08/04/2018 at Unknown time  . propranolol (INDERAL) 40 MG tablet Take by mouth.   Past Week at Unknown time  . sertraline (ZOLOFT) 100 MG tablet Take 1 tablet (100 mg total) by mouth daily. 90 tablet 3 Past Week at Unknown time  . Doxylamine-Pyridoxine (DICLEGIS) 10-10 MG TBEC Take 1 tablet by mouth 4 (four) times daily. Take 2 pills q hs, add one q am, one at lunch (Patient not taking: Reported  on 07/12/2018) 100 tablet 3 Not Taking  . metroNIDAZOLE (FLAGYL) 500 MG tablet Take 1 tablet (500 mg total) by mouth 2 (two) times daily. (Patient not taking: Reported on 07/29/2018) 14 tablet 0 Not Taking  . metroNIDAZOLE (FLAGYL) 500 MG tablet Take 1 tablet (500 mg total) by mouth 2 (two) times daily. (Patient not taking: Reported on 07/29/2018) 14 tablet 0 Not Taking    I have reviewed patient's Past Medical Hx, Surgical Hx, Family Hx, Social Hx, medications and allergies.   ROS:  Review of Systems  Constitutional: Negative for fever.  Gastrointestinal: Positive for abdominal pain and constipation. Negative for  diarrhea, nausea and vomiting.  Genitourinary: Negative for dysuria and frequency.  Musculoskeletal: Negative for myalgias.   Other systems negative  Physical Exam   Patient Vitals for the past 24 hrs:  BP Temp Temp src Pulse Resp SpO2 Height Weight  08/05/18 2015 118/70 99.2 F (37.3 C) Oral (!) 121 17 96 % 4\' 11"  (1.499 m) 124.7 kg   Constitutional: Well-developed, well-nourished female in no acute distress.  Cardiovascular: normal rate and rhythm Respiratory: normal effort, clear to auscultation bilaterally GI: Abd soft, non-tender, gravid appropriate for gestational age.   No rebound or guarding. MS: Extremities nontender, no edema, normal ROM Neurologic: Alert and oriented x 4.  GU: Neg CVAT.  PELVIC EXAM: .Dilation: Closed Effacement (%): 30 Station: Ballotable Exam by:: Wynelle Bourgeois, CNM  FHT:  Baseline 140 , moderate variability, accelerations present, no decelerations Contractions:  Irregular, occasional .  Has only had 1-2 while here   Labs: Results for orders placed or performed during the hospital encounter of 08/05/18 (from the past 24 hour(s))  Urinalysis, Routine w reflex microscopic     Status: Abnormal   Collection Time: 08/05/18  8:40 PM  Result Value Ref Range   Color, Urine YELLOW YELLOW   APPearance HAZY (A) CLEAR   Specific Gravity, Urine 1.023 1.005 - 1.030   pH 5.0 5.0 - 8.0   Glucose, UA NEGATIVE NEGATIVE mg/dL   Hgb urine dipstick NEGATIVE NEGATIVE   Bilirubin Urine NEGATIVE NEGATIVE   Ketones, ur 20 (A) NEGATIVE mg/dL   Protein, ur NEGATIVE NEGATIVE mg/dL   Nitrite NEGATIVE NEGATIVE   Leukocytes, UA NEGATIVE NEGATIVE  Fetal fibronectin     Status: None   Collection Time: 08/05/18  8:44 PM  Result Value Ref Range   Fetal Fibronectin NEGATIVE NEGATIVE  CBC with Differential/Platelet     Status: Abnormal   Collection Time: 08/05/18  9:02 PM  Result Value Ref Range   WBC 11.0 (H) 4.0 - 10.5 K/uL   RBC 4.07 3.87 - 5.11 MIL/uL   Hemoglobin  11.6 (L) 12.0 - 15.0 g/dL   HCT 16.1 (L) 09.6 - 04.5 %   MCV 88.2 80.0 - 100.0 fL   MCH 28.5 26.0 - 34.0 pg   MCHC 32.3 30.0 - 36.0 g/dL   RDW 40.9 (H) 81.1 - 91.4 %   Platelets 160 150 - 400 K/uL   nRBC 0.0 0.0 - 0.2 %   Neutrophils Relative % 74 %   Neutro Abs 8.2 (H) 1.7 - 7.7 K/uL   Lymphocytes Relative 18 %   Lymphs Abs 1.9 0.7 - 4.0 K/uL   Monocytes Relative 6 %   Monocytes Absolute 0.6 0.1 - 1.0 K/uL   Eosinophils Relative 2 %   Eosinophils Absolute 0.2 0.0 - 0.5 K/uL   Basophils Relative 0 %   Basophils Absolute 0.0 0.0 - 0.1 K/uL  A/Positive/-- (07/30 1120)  Imaging:    MAU Course/MDM: I have ordered labs and reviewed results.  NST reviewed, reactive with no appreciable contractions  Treatments in MAU included EFM  Fetal fibronectin was done and it was negative.  Even though we  Did not see contractions, I did the test based on her report of contractions and the negative result is reassuring.  We also did a CBC which did not show significant leukocytosis, to help rule out other acute abdominal processes UA is likewise normal   Assessment: Single intrauterine pregnancy at [redacted]w[redacted]d Lower abdominal cramping, not preterm labor Mild dehydration Intermittent constipation  Plan: Discharge home Discussed hydration.  Discussed adding fiber nightly .  May use Miralax PRN Preterm Labor precautions and fetal kick counts Follow up in Office for prenatal visits and recheck of status  Encouraged to return here or to other Urgent Care/ED if she develops worsening of symptoms, increase in pain, fever, or other concerning symptoms.   Pt stable at time of discharge.  Wynelle Bourgeois CNM, MSN Certified Nurse-Midwife 08/05/2018 8:35 PM

## 2018-08-05 NOTE — Discharge Instructions (Signed)
Preterm Labor and Birth Information Pregnancy normally lasts 39-41 weeks. Preterm labor is when labor starts early. It starts before you have been pregnant for 37 whole weeks. What are the risk factors for preterm labor? Preterm labor is more likely to occur in women who:  Have an infection while pregnant.  Have a cervix that is short.  Have gone into preterm labor before.  Have had surgery on their cervix.  Are younger than age 21.  Are older than age 8.  Are African American.  Are pregnant with two or more babies.  Take street drugs while pregnant.  Smoke while pregnant.  Do not gain enough weight while pregnant.  Got pregnant right after another pregnancy.  What are the symptoms of preterm labor? Symptoms of preterm labor include:  Cramps. The cramps may feel like the cramps some women get during their period. The cramps may happen with watery poop (diarrhea).  Pain in the belly (abdomen).  Pain in the lower back.  Regular contractions or tightening. It may feel like your belly is getting tighter.  Pressure in the lower belly that seems to get stronger.  More fluid (discharge) leaking from the vagina. The fluid may be watery or bloody.  Water breaking.  Why is it important to notice signs of preterm labor? Babies who are born early may not be fully developed. They have a higher chance for:  Long-term heart problems.  Long-term lung problems.  Trouble controlling body systems, like breathing.  Bleeding in the brain.  A condition called cerebral palsy.  Learning difficulties.  Death.  These risks are highest for babies who are born before 34 weeks of pregnancy. How is preterm labor treated? Treatment depends on:  How long you were pregnant.  Your condition.  The health of your baby.  Treatment may involve:  Having a stitch (suture) placed in your cervix. When you give birth, your cervix opens so the baby can come out. The stitch keeps the  cervix from opening too soon.  Staying at the hospital.  Taking or getting medicines, such as: ? Hormone medicines. ? Medicines to stop contractions. ? Medicines to help the babys lungs develop. ? Medicines to prevent your baby from having cerebral palsy.  What should I do if I am in preterm labor? If you think you are going into labor too soon, call your doctor right away. How can I prevent preterm labor?  Do not use any tobacco products. ? Examples of these are cigarettes, chewing tobacco, and e-cigarettes. ? If you need help quitting, ask your doctor.  Do not use street drugs.  Do not use any medicines unless you ask your doctor if they are safe for you.  Talk with your doctor before taking any herbal supplements.  Make sure you gain enough weight.  Watch for infection. If you think you might have an infection, get it checked right away.  If you have gone into preterm labor before, tell your doctor. This information is not intended to replace advice given to you by your health care provider. Make sure you discuss any questions you have with your health care provider. Document Released: 12/26/2008 Document Revised: 03/11/2016 Document Reviewed: 02/20/2016 Elsevier Interactive Patient Education  2018 Elsevier Inc. High-Fiber Diet Fiber, also called dietary fiber, is a type of carbohydrate found in fruits, vegetables, whole grains, and beans. A high-fiber diet can have many health benefits. Your health care provider may recommend a high-fiber diet to help:  Prevent constipation. Fiber can  make your bowel movements more regular.  Lower your cholesterol.  Relieve hemorrhoids, uncomplicated diverticulosis, or irritable bowel syndrome.  Prevent overeating as part of a weight-loss plan.  Prevent heart disease, type 2 diabetes, and certain cancers.  What is my plan? The recommended daily intake of fiber includes:  38 grams for men under age 100.  30 grams for men over age  21.  25 grams for women under age 62.  21 grams for women over age 24.  You can get the recommended daily intake of dietary fiber by eating a variety of fruits, vegetables, grains, and beans. Your health care provider may also recommend a fiber supplement if it is not possible to get enough fiber through your diet. What do I need to know about a high-fiber diet?  Fiber supplements have not been widely studied for their effectiveness, so it is better to get fiber through food sources.  Always check the fiber content on thenutrition facts label of any prepackaged food. Look for foods that contain at least 5 grams of fiber per serving.  Ask your dietitian if you have questions about specific foods that are related to your condition, especially if those foods are not listed in the following section.  Increase your daily fiber consumption gradually. Increasing your intake of dietary fiber too quickly may cause bloating, cramping, or gas.  Drink plenty of water. Water helps you to digest fiber. What foods can I eat? Grains Whole-grain breads. Multigrain cereal. Oats and oatmeal. Brown rice. Barley. Bulgur wheat. Millet. Bran muffins. Popcorn. Rye wafer crackers. Vegetables Sweet potatoes. Spinach. Kale. Artichokes. Cabbage. Broccoli. Green peas. Carrots. Squash. Fruits Berries. Pears. Apples. Oranges. Avocados. Prunes and raisins. Dried figs. Meats and Other Protein Sources [place], kidney, pinto, and soy beans. Split peas. Lentils. Nuts and seeds. Dairy Fiber-fortified yogurt. Beverages Fiber-fortified soy milk. Fiber-fortified orange juice. Other Fiber bars. The items listed above may not be a complete list of recommended foods or beverages. Contact your dietitian for more options. What foods are not recommended? Grains White bread. Pasta made with refined flour. White rice. Vegetables Fried potatoes. Canned vegetables. Well-cooked vegetables. Fruits Fruit juice. Cooked, strained  fruit. Meats and Other Protein Sources Fatty cuts of meat. Fried Environmental education officer or fried fish. Dairy Milk. Yogurt. Cream cheese. Sour cream. Beverages Soft drinks. Other Cakes and pastries. Butter and oils. The items listed above may not be a complete list of foods and beverages to avoid. Contact your dietitian for more information. What are some tips for including high-fiber foods in my diet?  Eat a wide variety of high-fiber foods.  Make sure that half of all grains consumed each day are whole grains.  Replace breads and cereals made from refined flour or white flour with whole-grain breads and cereals.  Replace white rice with brown rice, bulgur wheat, or millet.  Start the day with a breakfast that is high in fiber, such as a cereal that contains at least 5 grams of fiber per serving.  Use beans in place of meat in soups, salads, or pasta.  Eat high-fiber snacks, such as berries, raw vegetables, nuts, or popcorn. This information is not intended to replace advice given to you by your health care provider. Make sure you discuss any questions you have with your health care provider. Document Released: 09/29/2005 Document Revised: 03/06/2016 Document Reviewed: 03/14/2014 Elsevier Interactive Patient Education  2018 [employer].   Constipation, Adult Constipation is when a person:  Poops (has a bowel movement) fewer times in  a week than normal.  Has a hard time pooping.  Has poop that is dry, hard, or bigger than normal.  Follow these instructions at home: Eating and drinking   Eat foods that have a lot of fiber, such as: ? Fresh fruits and vegetables. ? Whole grains. ? Beans.  Eat less of foods that are high in fat, low in fiber, or overly processed, such as: ? [place] fries. ? Hamburgers. ? Cookies. ? Candy. ? Soda.  Drink enough fluid to keep your pee (urine) clear or pale yellow. General instructions  Exercise regularly or as told by your doctor.  Go to the  restroom when you feel like you need to poop. Do not hold it in.  Take over-the-counter and prescription medicines only as told by your doctor. These include any fiber supplements.  Do pelvic floor retraining exercises, such as: ? Doing deep breathing while relaxing your lower belly (abdomen). ? Relaxing your pelvic floor while pooping.  Watch your condition for any changes.  Keep all follow-up visits as told by your doctor. This is important. Contact a doctor if:  You have pain that gets worse.  You have a fever.  You have not pooped for 4 days.  You throw up (vomit).  You are not hungry.  You lose weight.  You are bleeding from the anus.  You have thin, pencil-like poop (stool). Get help right away if:  You have a fever, and your symptoms suddenly get worse.  You leak poop or have blood in your poop.  Your belly feels hard or bigger than normal (is bloated).  You have very bad belly pain.  You feel dizzy or you faint. This information is not intended to replace advice given to you by your health care provider. Make sure you discuss any questions you have with your health care provider. Document Released: 03/17/2008 Document Revised: 04/18/2016 Document Reviewed: 03/19/2016 Elsevier Interactive Patient Education  2018 [employer].

## 2018-08-05 NOTE — MAU Note (Signed)
Pt reports lower abdominal cramping that started last night that was every couple of minutes. Has continued to have cramping, although patient reports it is not as frequent. Rates 5/10. Pt denies vaginal bleeding or LOF. Reports good fetal movement. Denies urinary s/s. Pt reports constipation-last BM today.

## 2018-08-10 ENCOUNTER — Encounter: Payer: Medicaid Other | Admitting: Family Medicine

## 2018-08-11 ENCOUNTER — Ambulatory Visit (INDEPENDENT_AMBULATORY_CARE_PROVIDER_SITE_OTHER): Payer: Medicaid Other | Admitting: Advanced Practice Midwife

## 2018-08-11 VITALS — BP 120/80 | HR 104 | Wt 279.2 lb

## 2018-08-11 DIAGNOSIS — O10919 Unspecified pre-existing hypertension complicating pregnancy, unspecified trimester: Secondary | ICD-10-CM

## 2018-08-11 DIAGNOSIS — N760 Acute vaginitis: Secondary | ICD-10-CM

## 2018-08-11 DIAGNOSIS — O0992 Supervision of high risk pregnancy, unspecified, second trimester: Secondary | ICD-10-CM

## 2018-08-11 DIAGNOSIS — B9689 Other specified bacterial agents as the cause of diseases classified elsewhere: Secondary | ICD-10-CM

## 2018-08-11 MED ORDER — METRONIDAZOLE 0.75 % VA GEL: 1.0000 | Freq: Every day | VAGINAL | 1 refills | Status: DC

## 2018-08-11 NOTE — Patient Instructions (Signed)
Research childbirth classes and hospital preregistration at [email or web address]  Fetal Movement Counts Patient Name: ________________________________________________ Patient Due Date: ____________________ What is a fetal movement count? A fetal movement count is the number of times that you feel your baby move during a certain amount of time. This may also be called a fetal kick count. A fetal movement count is recommended for every pregnant woman. You may be asked to start counting fetal movements as early as week 28 of your pregnancy. Pay attention to when your baby is most active. You may notice your baby's sleep and wake cycles. You may also notice things that make your baby move more. You should do a fetal movement count:  When your baby is normally most active.  At the same time each day.  A good time to count movements is while you are resting, after having something to eat and drink. How do I count fetal movements? 1. Find a quiet, comfortable area. Sit, or lie down on your side. 2. Write down the date, the start time and stop time, and the number of movements that you felt between those two times. Take this information with you to your health care visits. 3. For 2 hours, count kicks, flutters, swishes, rolls, and jabs. You should feel at least 10 movements during 2 hours. 4. You may stop counting after you have felt 10 movements. 5. If you do not feel 10 movements in 2 hours, have something to eat and drink. Then, keep resting and counting for 1 hour. If you feel at least 4 movements during that hour, you may stop counting. Contact a health care provider if:  You feel fewer than 4 movements in 2 hours.  Your baby is not moving like he or she usually does. Date: ____________ Start time: ____________ Stop time: ____________ Movements: ____________ Date: ____________ Start time: ____________ Stop time: ____________ Movements: ____________ Date: ____________ Start time: ____________  Stop time: ____________ Movements: ____________ Date: ____________ Start time: ____________ Stop time: ____________ Movements: ____________ Date: ____________ Start time: ____________ Stop time: ____________ Movements: ____________ Date: ____________ Start time: ____________ Stop time: ____________ Movements: ____________ Date: ____________ Start time: ____________ Stop time: ____________ Movements: ____________ Date: ____________ Start time: ____________ Stop time: ____________ Movements: ____________ Date: ____________ Start time: ____________ Stop time: ____________ Movements: ____________ This information is not intended to replace advice given to you by your health care provider. Make sure you discuss any questions you have with your health care provider. Document Released: 10/29/2006 Document Revised: 05/28/2016 Document Reviewed: 11/08/2015 Elsevier Interactive Patient Education  2018 Elsevier Inc.  Braxton Hicks Contractions Contractions of the uterus can occur throughout pregnancy, but they are not always a sign that you are in labor. You may have practice contractions called Braxton Hicks contractions. These false labor contractions are sometimes confused with true labor. What are Braxton Hicks contractions? Braxton Hicks contractions are tightening movements that occur in the muscles of the uterus before labor. Unlike true labor contractions, these contractions do not result in opening (dilation) and thinning of the cervix. Toward the end of pregnancy (32-34 weeks), Braxton Hicks contractions can happen more often and may become stronger. These contractions are sometimes difficult to tell apart from true labor because they can be very uncomfortable. You should not feel embarrassed if you go to the hospital with false labor. Sometimes, the only way to tell if you are in true labor is for your health care provider to look for changes in the cervix. The health care provider will   do a physical  exam and may monitor your contractions. If you are not in true labor, the exam should show that your cervix is not dilating and your water has not broken. If there are other health problems associated with your pregnancy, it is completely safe for you to be sent home with false labor. You may continue to have Braxton Hicks contractions until you go into true labor. How to tell the difference between true labor and false labor True labor  Contractions last 30-70 seconds.  Contractions become very regular.  Discomfort is usually felt in the top of the uterus, and it spreads to the lower abdomen and low back.  Contractions do not go away with walking.  Contractions usually become more intense and increase in frequency.  The cervix dilates and gets thinner. False labor  Contractions are usually shorter and not as strong as true labor contractions.  Contractions are usually irregular.  Contractions are often felt in the front of the lower abdomen and in the groin.  Contractions may go away when you walk around or change positions while lying down.  Contractions get weaker and are shorter-lasting as time goes on.  The cervix usually does not dilate or become thin. Follow these instructions at home:  Take over-the-counter and prescription medicines only as told by your health care provider.  Keep up with your usual exercises and follow other instructions from your health care provider.  Eat and drink lightly if you think you are going into labor.  If Braxton Hicks contractions are making you uncomfortable: ? Change your position from lying down or resting to walking, or change from walking to resting. ? Sit and rest in a tub of warm water. ? Drink enough fluid to keep your urine pale yellow. Dehydration may cause these contractions. ? Do slow and deep breathing several times an hour.  Keep all follow-up prenatal visits as told by your health care provider. This is  important. Contact a health care provider if:  You have a fever.  You have continuous pain in your abdomen. Get help right away if:  Your contractions become stronger, more regular, and closer together.  You have fluid leaking or gushing from your vagina.  You pass blood-tinged mucus (bloody show).  You have bleeding from your vagina.  You have low back pain that you never had before.  You feel your baby's head pushing down and causing pelvic pressure.  Your baby is not moving inside you as much as it used to. Summary  Contractions that occur before labor are called Braxton Hicks contractions, false labor, or practice contractions.  Braxton Hicks contractions are usually shorter, weaker, farther apart, and less regular than true labor contractions. True labor contractions usually become progressively stronger and regular and they become more frequent.  Manage discomfort from Braxton Hicks contractions by changing position, resting in a warm bath, drinking plenty of water, or practicing deep breathing. This information is not intended to replace advice given to you by your health care provider. Make sure you discuss any questions you have with your health care provider. Document Released: 02/12/2017 Document Revised: 02/12/2017 Document Reviewed: 02/12/2017 Elsevier Interactive Patient Education  2018 Elsevier Inc.    

## 2018-08-11 NOTE — Progress Notes (Signed)
   PRENATAL VISIT NOTE  Subjective:  Suzanne Frederick is a 22 y.o. G1P0 at [redacted]w[redacted]d being seen today for ongoing prenatal care.  She is currently monitored for the following issues for this high-risk pregnancy and has Supervision of high risk pregnancy, antepartum; ADHD (attention deficit hyperactivity disorder); Depression; Menorrhagia; Headache; Treacher Collins syndrome; Obesity complicating peripregnancy, antepartum; Nausea and vomiting of pregnancy, antepartum; BMI 50.0-59.9, adult (HCC); Transient hypertension of pregnancy; and Abnormal glucose affecting pregnancy on their problem list.  Patient reports no complaints.   .  .  Movement: Present. Denies leaking of fluid.   The following portions of the patient's history were reviewed and updated as appropriate: allergies, current medications, past family history, past medical history, past social history, past surgical history and problem list. Problem list updated.  Objective:   Vitals:   08/11/18 1629  BP: 120/80  Pulse: (!) 104  Weight: 279 lb 3.2 oz (126.6 kg)    Fetal Status: Fetal Heart Rate (bpm): 130 Fundal Height: 26 cm Movement: Present     General:  Alert, oriented and cooperative. Patient is in no acute distress.  Skin: Skin is warm and dry. No rash noted.   Cardiovascular: Normal heart rate noted  Respiratory: Normal respiratory effort, no problems with respiration noted  Abdomen: Soft, gravid, appropriate for gestational age.  Pain/Pressure: Present     Pelvic: Cervical exam deferred        Extremities: Normal range of motion.  Edema: Trace  Mental Status: Normal mood and affect. Normal behavior. Normal judgment and thought content.   Assessment and Plan:  Pregnancy: G1P0 at [redacted]w[redacted]d  1. Supervision of high risk pregnancy in second trimester --Nervous after MAU visit 08/05/18 --Discussed Negative FFN result --Reviewed signs/symptoms which would merit return to MAU --Fasting next visit for GTT and other third trimester  labs --FH compromised by maternal habitus. EFW 74% on 07/29/18  2. Bacterial vaginosis --Patient requesting Metrogel rather than PO Flagyl, rx to pharmacy  3. Chronic hypertension affecting pregnancy --Normotensive today --Taking daily Aspirin 81mg   Preterm labor symptoms and general obstetric precautions including but not limited to vaginal bleeding, contractions, leaking of fluid and fetal movement were reviewed in detail with the patient. Please refer to After Visit Summary for other counseling recommendations.  No follow-ups on file.  Future Appointments  Date Time Provider Department Center  08/23/2018 12:00 PM [facility] [place] [facility]  09/02/2018  8:45 AM Bayard Bing, MD [facility]    Calvert Cantor, CNM

## 2018-08-23 ENCOUNTER — Other Ambulatory Visit (HOSPITAL_COMMUNITY): Payer: Self-pay | Admitting: *Deleted

## 2018-08-23 ENCOUNTER — Encounter (HOSPITAL_COMMUNITY): Payer: Self-pay

## 2018-08-23 ENCOUNTER — Ambulatory Visit (HOSPITAL_COMMUNITY)
Admission: RE | Admit: 2018-08-23 | Discharge: 2018-08-23 | Disposition: A | Discharge status: Home / Self Care | Payer: Medicaid Other | Source: Ambulatory Visit | Attending: Obstetrics & Gynecology | Admitting: Obstetrics & Gynecology

## 2018-08-23 DIAGNOSIS — O10919 Unspecified pre-existing hypertension complicating pregnancy, unspecified trimester: Secondary | ICD-10-CM

## 2018-08-23 DIAGNOSIS — O139 Gestational [pregnancy-induced] hypertension without significant proteinuria, unspecified trimester: Secondary | ICD-10-CM | POA: Diagnosis not present

## 2018-08-23 DIAGNOSIS — O0990 Supervision of high risk pregnancy, unspecified, unspecified trimester: Secondary | ICD-10-CM

## 2018-08-23 DIAGNOSIS — O99212 Obesity complicating pregnancy, second trimester: Secondary | ICD-10-CM | POA: Diagnosis not present

## 2018-08-23 DIAGNOSIS — Z3A27 27 weeks gestation of pregnancy: Secondary | ICD-10-CM | POA: Diagnosis not present

## 2018-08-23 DIAGNOSIS — O10912 Unspecified pre-existing hypertension complicating pregnancy, second trimester: Secondary | ICD-10-CM | POA: Diagnosis present

## 2018-08-23 DIAGNOSIS — Z362 Encounter for other antenatal screening follow-up: Secondary | ICD-10-CM | POA: Diagnosis not present

## 2018-08-23 DIAGNOSIS — O99210 Obesity complicating pregnancy, unspecified trimester: Secondary | ICD-10-CM

## 2018-08-23 IMAGING — US US MFM OB FOLLOW-UP
1 series · 12 of 28 positions shown · non-contrast
Comparison: none

[Series 1: us mfm ob follow-up · 12 of 32 slices shown]
[im 2/32]
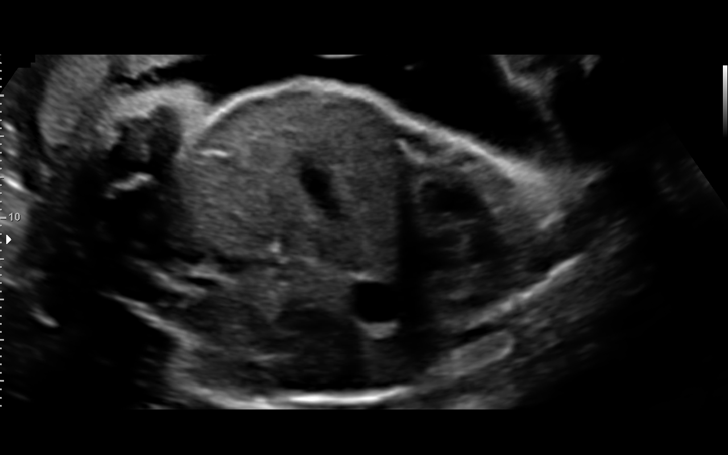
[im 4/32]
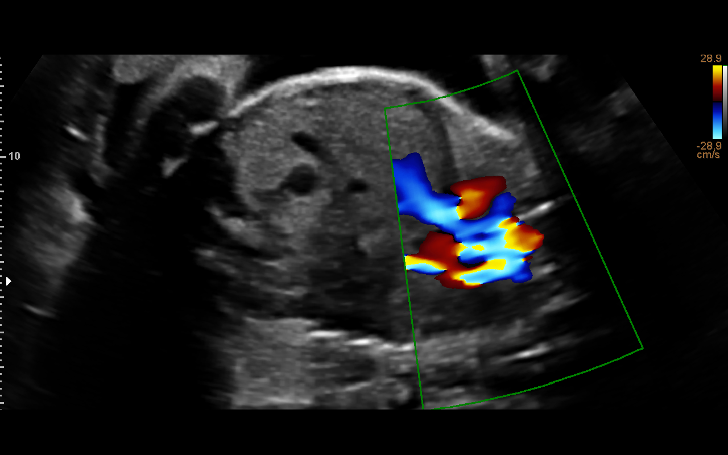
[im 6/32]
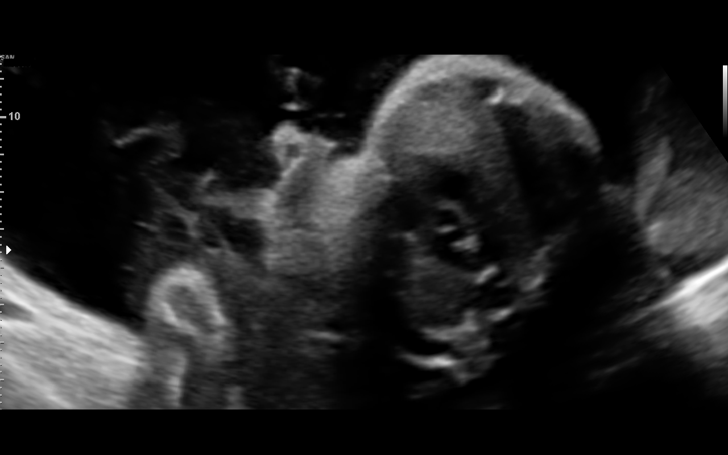
[im 10/32]
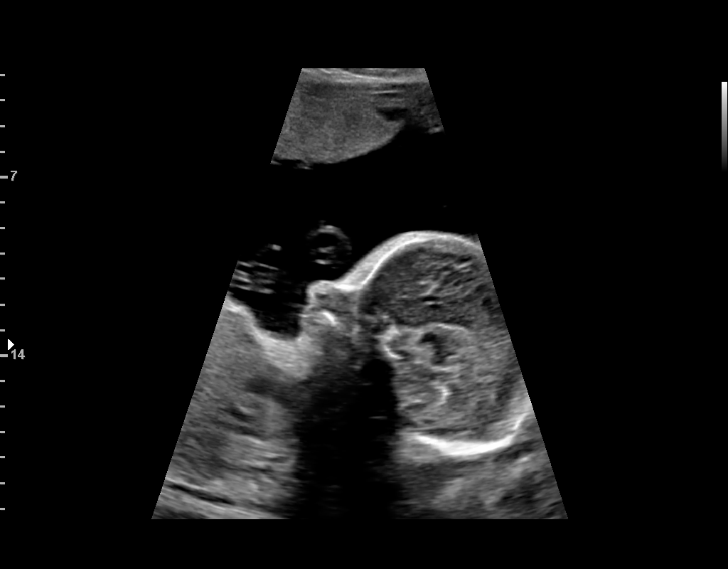
[im 12/32]
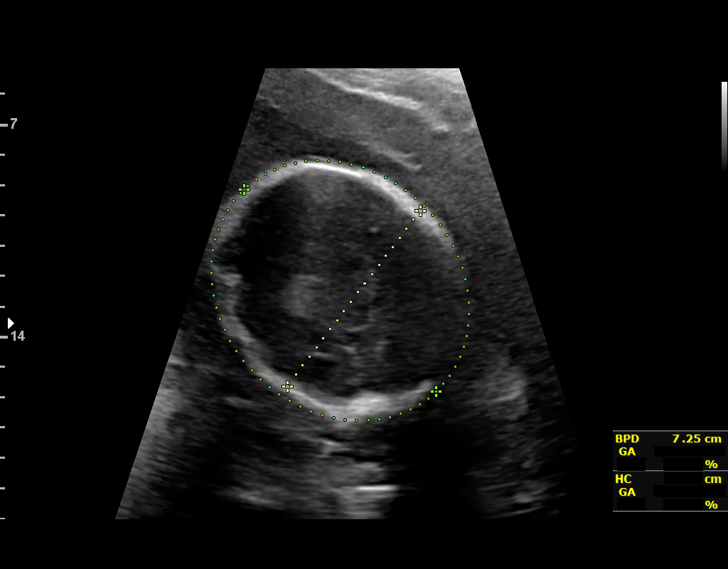
[im 14/32]
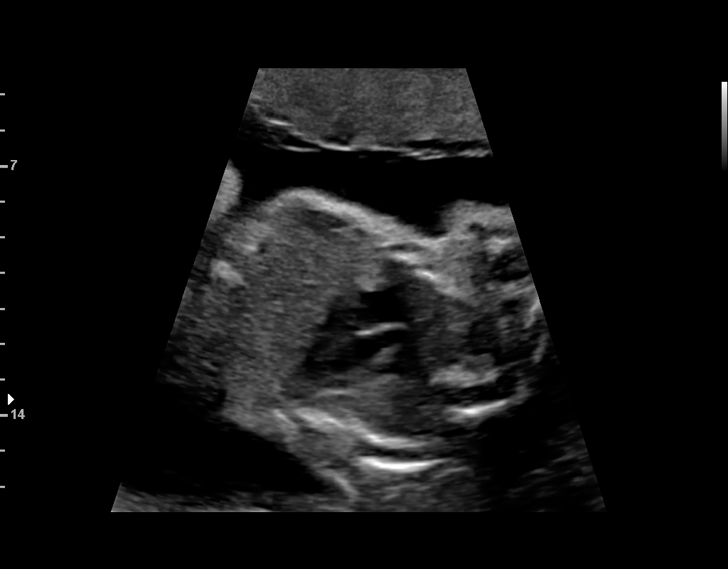
[im 18/32]
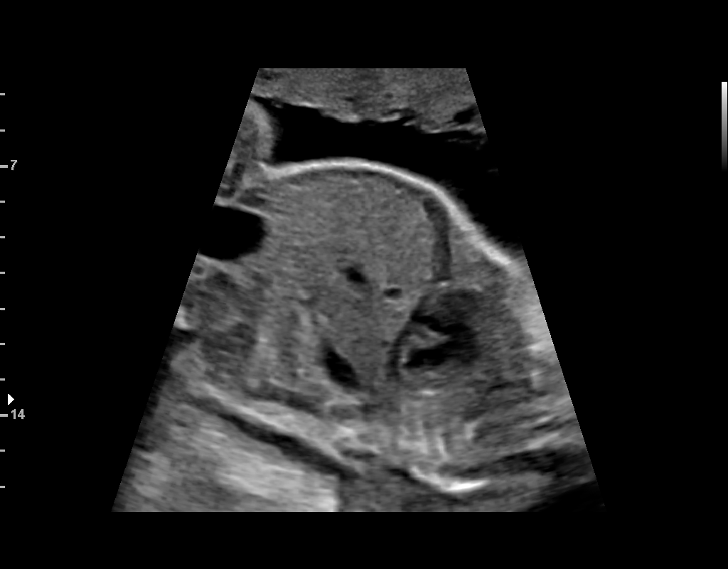
[im 20/32]
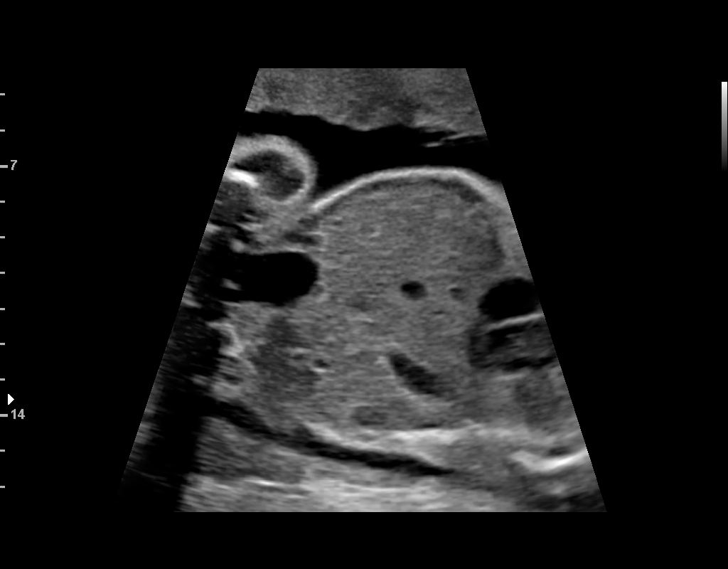
[im 22/32]
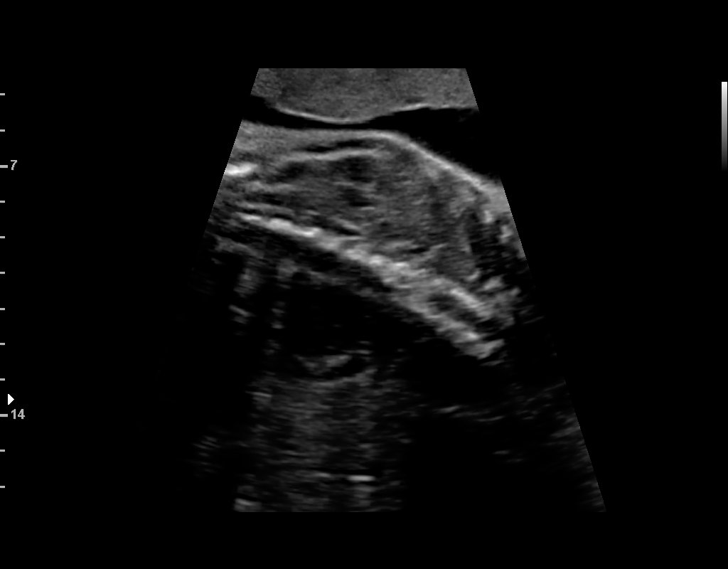
[im 26/32]
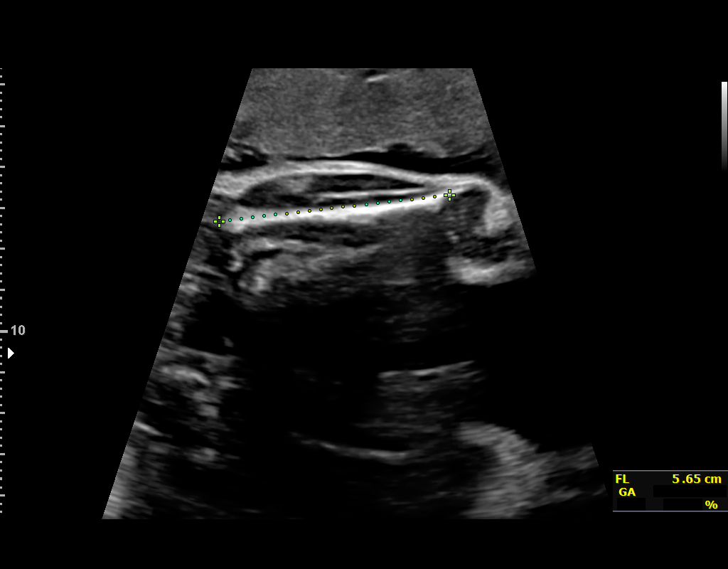
[im 28/32]
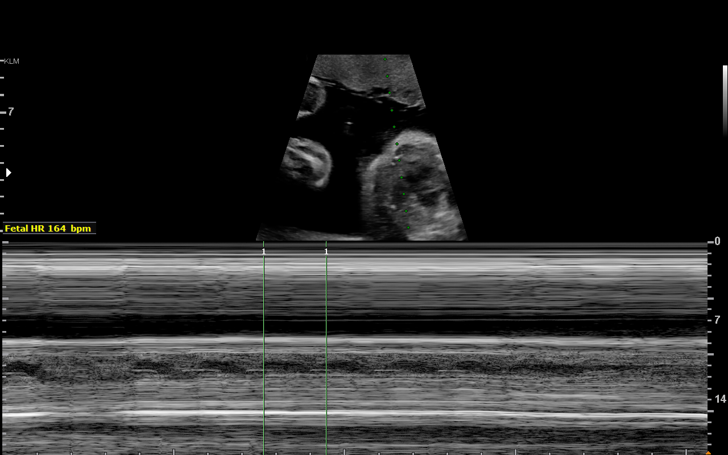
[im 30/32]
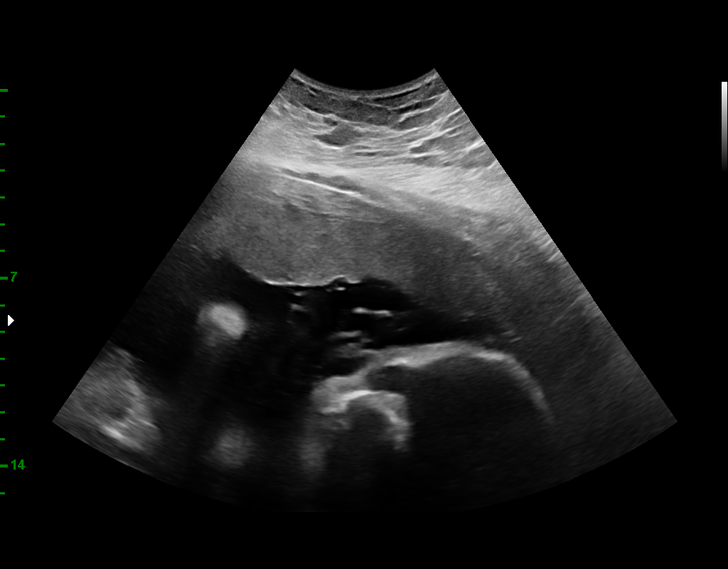

[12 of 28 positions shown; findings below may reference images not displayed]

----------------------------------------------------------------------

 ----------------------------------------------------------------------
Indications

  Antenatal follow-up for nonvisualized fetal    [CR]
  anatomy
  27 weeks gestation of pregnancy
  Genetic carrier (specify) Pt withTreacher      [CR]
  OKTOVIANUS Syndrome
  Hypertension - Gestational vs CHTN             [CR]
  Maternal morbid obesity                        [CR] [CR]
  Low risk NIPS
 ----------------------------------------------------------------------
Vital Signs

                                                Height:        4'11"
Fetal Evaluation

 Num Of Fetuses:         1
 Fetal Heart Rate(bpm):  164
 Cardiac Activity:       Observed
 Presentation:           Cephalic
 Placenta:               Anterior
 P. Cord Insertion:      Previously Visualized

 Amniotic Fluid
 AFI FV:      Within normal limits

                             Largest Pocket(cm)

Biometry

 BPD:      71.3  mm     G. Age:  28w 4d         84  %    CI:        72.43   %    70 - 86
                                                         FL/HC:      20.4   %    18.6 -
 HC:      266.5  mm     G. Age:  29w 0d         80  %    HC/AC:      0.99        1.05 -
 AC:      268.9  mm     G. Age:  31w 0d       > 97  %    FL/BPD:     76.2   %    71 - 87
 FL:       54.3  mm     G. Age:  28w 5d         80  %    FL/AC:      20.2   %    20 - 24

 Est. FW:    [CR]  gm      3 lb 4 oz     89  %
OB History

 Gravidity:    1
Gestational Age

 Clinical EDD:  27w 1d                                        EDD:   [DATE]
 U/S Today:     29w 2d                                        EDD:   [DATE]
 Best:          27w 1d     Det. By:  Clinical EDD             EDD:   [DATE]
Anatomy

 Cranium:               Appears normal         Aortic Arch:            Appears normal
 Cavum:                 Appears normal         Ductal Arch:            Appears normal
 Ventricles:            Previously seen        Diaphragm:              Appears normal
 Choroid Plexus:        Previously seen        Stomach:                Appears normal, left
                                                                       sided
 Cerebellum:            Previously seen        Abdomen:                Appears normal
 Posterior Fossa:       Previously seen        Abdominal Wall:         Previously seen
 Nuchal Fold:           Previously seen        Cord Vessels:           Previously seen
 Face:                  Orbits and profile     Kidneys:                Appear normal
                        previously seen
 Lips:                  Previously seen        Bladder:                Appears normal
 Thoracic:              Appears normal         Spine:                  Previously seen
 Heart:                 Appears normal         Upper Extremities:      Previously seen
                        (4CH, axis, and situs
 RVOT:                  Appears normal         Lower Extremities:      Previously seen
 LVOT:                  Appears normal

 Other:  Nasal bone previously visualized.
Cervix Uterus Adnexa

 Cervix
 Not visualized (advanced GA >[CR])
Impression

 The estimated fetal weight is at greater than the 90th
 percentile. Abdominal circumference measures at greater
 than the 95th percentile. Amniotic fluid is normal and good
 fetal activity is seen.

 Patient has not had screening for gestational diabetes. She
 has chronic hypertension and takes inderal. Her BP at our
 office today: 113/74 mm Hg.
 I encouraged her to screen for gestational diabetes this week.
Recommendations

 An appointment was made for her to return in 4 weeks for
 fetal growth assessment.
                 OKTOVIANUS

## 2018-09-02 ENCOUNTER — Ambulatory Visit (INDEPENDENT_AMBULATORY_CARE_PROVIDER_SITE_OTHER): Payer: Medicaid Other | Admitting: Obstetrics and Gynecology

## 2018-09-02 VITALS — BP 126/93 | HR 101 | Wt 285.2 lb

## 2018-09-02 DIAGNOSIS — O0990 Supervision of high risk pregnancy, unspecified, unspecified trimester: Secondary | ICD-10-CM

## 2018-09-02 DIAGNOSIS — O99210 Obesity complicating pregnancy, unspecified trimester: Secondary | ICD-10-CM

## 2018-09-02 DIAGNOSIS — Z3A28 28 weeks gestation of pregnancy: Secondary | ICD-10-CM

## 2018-09-02 DIAGNOSIS — O0992 Supervision of high risk pregnancy, unspecified, second trimester: Secondary | ICD-10-CM

## 2018-09-02 DIAGNOSIS — Z6841 Body Mass Index (BMI) 40.0 and over, adult: Secondary | ICD-10-CM

## 2018-09-02 DIAGNOSIS — O24419 Gestational diabetes mellitus in pregnancy, unspecified control: Secondary | ICD-10-CM

## 2018-09-02 DIAGNOSIS — O133 Gestational [pregnancy-induced] hypertension without significant proteinuria, third trimester: Secondary | ICD-10-CM

## 2018-09-02 DIAGNOSIS — O99213 Obesity complicating pregnancy, third trimester: Secondary | ICD-10-CM

## 2018-09-02 DIAGNOSIS — O0993 Supervision of high risk pregnancy, unspecified, third trimester: Secondary | ICD-10-CM

## 2018-09-02 NOTE — Progress Notes (Signed)
-  tdap today

## 2018-09-02 NOTE — Progress Notes (Signed)
Prenatal Visit Note Date: 09/02/2018 Clinic: [facility]  Subjective:  Suzanne Frederick is a 22 y.o. G1P0 at 4368w4d being seen today for ongoing prenatal care.  She is currently monitored for the following issues for this high-risk pregnancy and has Supervision of high risk pregnancy, antepartum; ADHD (attention deficit hyperactivity disorder); Depression; Menorrhagia; Headache; Treacher Collins syndrome; Obesity complicating peripregnancy, antepartum; Nausea and vomiting of pregnancy, antepartum; BMI 50.0-59.9, adult (HCC); Treat as a patient with Chronic HTN; and Abnormal glucose affecting pregnancy on their problem list.  Patient reports no complaints.   Contractions: Not present. Vag. Bleeding: None.  Movement: Present. Denies leaking of fluid.   The following portions of the patient's history were reviewed and updated as appropriate: allergies, current medications, past family history, past medical history, past social history, past surgical history and problem list. Problem list updated.  Objective:   Vitals:   09/02/18 0908  BP: (!) 126/93  Pulse: (!) 101  Weight: 285 lb 3.2 oz (129.4 kg)    Fetal Status: Fetal Heart Rate (bpm): 156   Movement: Present     General:  Alert, oriented and cooperative. Patient is in no acute distress.  Skin: Skin is warm and dry. No rash noted.   Cardiovascular: Normal heart rate noted  Respiratory: Normal respiratory effort, no problems with respiration noted  Abdomen: Soft, gravid, appropriate for gestational age. Pain/Pressure: Present     Pelvic:  Cervical exam deferred        Extremities: Normal range of motion.  Edema: None  Mental Status: Normal mood and affect. Normal behavior. Normal judgment and thought content.   Urinalysis:      Assessment and Plan:  Pregnancy: G1P0 at 6568w4d  1. Supervision of high risk pregnancy in second trimester Routine care - Glucose Tolerance, 2 Hours w/1 Hour - RPR - HIV Antibody  (routine testing w rflx) - CBC  2. Chronic hypertension of pregnancy in third trimester On inderal 25 qday (for migraine ppx). Will tx as cHTN per mfm recommendations. D/w her re: qwk testing at 32wks and continue with qmonth growth u/s. No s/s of pre-eclampsia. Continue low dose asa  3. Supervision of high risk pregnancy, antepartum Depo provera. Pt to call back sleep study place to see if can set up   4. Obesity complicating peripregnancy, antepartum 35wgt gain this pregnancy. D/w her re: goal of try to end up in the low to mid 290s  5. BMI 50.0-59.9, adult (HCC)  Preterm labor symptoms and general obstetric precautions including but not limited to vaginal bleeding, contractions, leaking of fluid and fetal movement were reviewed in detail with the patient. Please refer to After Visit Summary for other counseling recommendations.  Return in about 2 weeks (around 09/16/2018) for hrob.   [place] BingPickens, Suzanne Mcsorley, MD

## 2018-09-03 ENCOUNTER — Encounter: Payer: Self-pay | Admitting: *Deleted

## 2018-09-03 LAB — GLUCOSE TOLERANCE, 2 HOURS W/ 1HR
GLUCOSE, 2 HOUR: 200 mg/dL — AB (ref 65–152)
GLUCOSE, FASTING: 170 mg/dL — AB (ref 65–91)
Glucose, 1 hour: 257 mg/dL — ABNORMAL HIGH (ref 65–179)

## 2018-09-03 LAB — RPR: RPR Ser Ql: NONREACTIVE

## 2018-09-03 LAB — CBC
HEMATOCRIT: 33.5 % — AB (ref 34.0–46.6)
Hemoglobin: 10.7 g/dL — ABNORMAL LOW (ref 11.1–15.9)
MCH: 27.6 pg (ref 26.6–33.0)
MCHC: 31.9 g/dL (ref 31.5–35.7)
MCV: 87 fL (ref 79–97)
PLATELETS: 161 10*3/uL (ref 150–450)
RBC: 3.87 x10E6/uL (ref 3.77–5.28)
RDW: 14.9 % (ref 12.3–15.4)
WBC: 10.6 10*3/uL (ref 3.4–10.8)

## 2018-09-03 LAB — HIV ANTIBODY (ROUTINE TESTING W REFLEX): HIV SCREEN 4TH GENERATION: NONREACTIVE

## 2018-09-04 ENCOUNTER — Encounter: Payer: Self-pay | Admitting: Obstetrics and Gynecology

## 2018-09-04 DIAGNOSIS — O24419 Gestational diabetes mellitus in pregnancy, unspecified control: Secondary | ICD-10-CM | POA: Insufficient documentation

## 2018-09-06 ENCOUNTER — Telehealth: Payer: Self-pay | Admitting: *Deleted

## 2018-09-06 ENCOUNTER — Other Ambulatory Visit: Payer: Self-pay | Admitting: *Deleted

## 2018-09-06 DIAGNOSIS — O24419 Gestational diabetes mellitus in pregnancy, unspecified control: Secondary | ICD-10-CM

## 2018-09-06 NOTE — Telephone Encounter (Signed)
Called patient to inform her of her 2hr Gtt results. Order placed for referral, informed patient they will call her to get her in.

## 2018-09-06 NOTE — Telephone Encounter (Signed)
-----   Message from [place] Bingharlie Pickens, MD sent at 09/04/2018  9:12 AM EST ----- Please set her up with GDM education, supplies, babyscripts, etc. Thanks!

## 2018-09-06 NOTE — Addendum Note (Signed)
Addended by: Scheryl MartenSOLIZ, Ivelise Castillo on: 09/06/2018 08:56 AM   Modules accepted: Orders

## 2018-09-07 LAB — HEMOGLOBIN A1C
Est. average glucose Bld gHb Est-mCnc: 131 mg/dL
HEMOGLOBIN A1C: 6.2 % — AB (ref 4.8–5.6)

## 2018-09-07 LAB — SPECIMEN STATUS REPORT

## 2018-09-16 ENCOUNTER — Encounter: Payer: Medicaid Other | Admitting: Obstetrics & Gynecology

## 2018-09-16 NOTE — Progress Notes (Deleted)
   Patient did not show up today for her scheduled appointment.   Karan Ramnauth, MD, FACOG Obstetrician & Gynecologist, [facility], [facility]  

## 2018-09-20 ENCOUNTER — Ambulatory Visit (HOSPITAL_COMMUNITY)
Admission: RE | Admit: 2018-09-20 | Discharge: 2018-09-20 | Disposition: A | Discharge status: Home / Self Care | Payer: Medicaid Other | Source: Ambulatory Visit | Attending: Obstetrics & Gynecology | Admitting: Obstetrics & Gynecology

## 2018-09-20 ENCOUNTER — Encounter (HOSPITAL_COMMUNITY): Payer: Self-pay

## 2018-09-20 DIAGNOSIS — O99213 Obesity complicating pregnancy, third trimester: Secondary | ICD-10-CM

## 2018-09-20 DIAGNOSIS — O24410 Gestational diabetes mellitus in pregnancy, diet controlled: Secondary | ICD-10-CM

## 2018-09-20 DIAGNOSIS — Z148 Genetic carrier of other disease: Secondary | ICD-10-CM

## 2018-09-20 DIAGNOSIS — Z3A31 31 weeks gestation of pregnancy: Secondary | ICD-10-CM | POA: Diagnosis not present

## 2018-09-20 DIAGNOSIS — O10913 Unspecified pre-existing hypertension complicating pregnancy, third trimester: Secondary | ICD-10-CM | POA: Diagnosis not present

## 2018-09-20 DIAGNOSIS — O403XX0 Polyhydramnios, third trimester, not applicable or unspecified: Secondary | ICD-10-CM

## 2018-09-20 DIAGNOSIS — O10919 Unspecified pre-existing hypertension complicating pregnancy, unspecified trimester: Secondary | ICD-10-CM | POA: Diagnosis present

## 2018-09-20 DIAGNOSIS — Z362 Encounter for other antenatal screening follow-up: Secondary | ICD-10-CM

## 2018-09-20 DIAGNOSIS — O139 Gestational [pregnancy-induced] hypertension without significant proteinuria, unspecified trimester: Secondary | ICD-10-CM | POA: Diagnosis not present

## 2018-09-20 DIAGNOSIS — O99210 Obesity complicating pregnancy, unspecified trimester: Secondary | ICD-10-CM

## 2018-09-20 DIAGNOSIS — O0990 Supervision of high risk pregnancy, unspecified, unspecified trimester: Secondary | ICD-10-CM

## 2018-09-20 IMAGING — US US MFM OB FOLLOW-UP
1 series · 14 of 28 positions shown · non-contrast
Comparison: none

[Series 1: us mfm ob follow-up · 53 acquisitions, 14 frames shown]
[im 2/53]
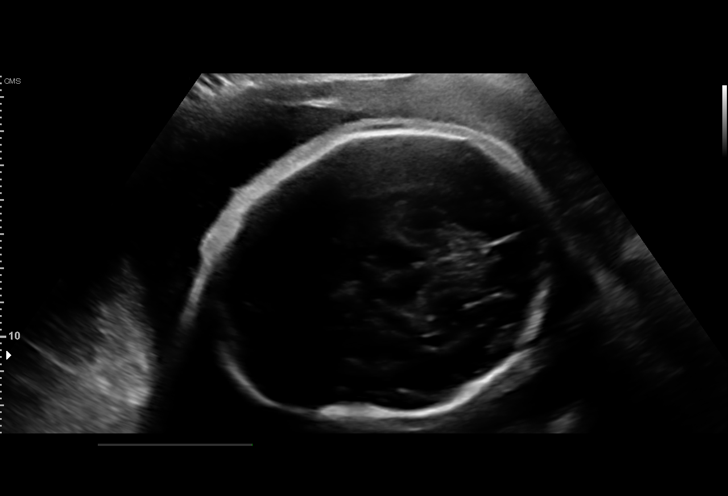
[im 6/53]
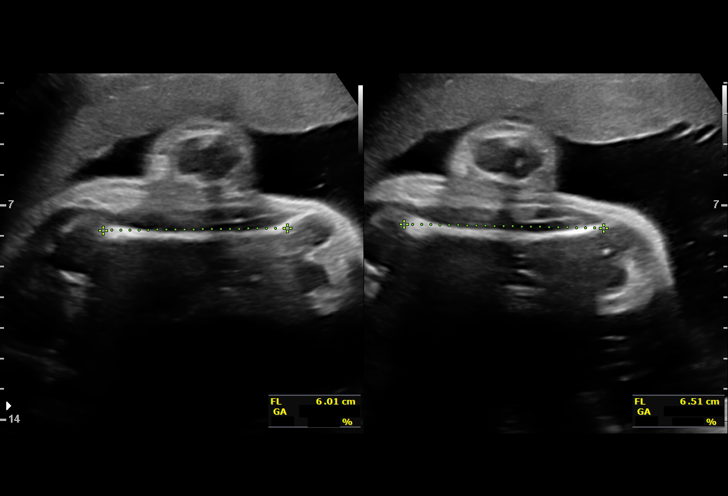
[im 10/53]
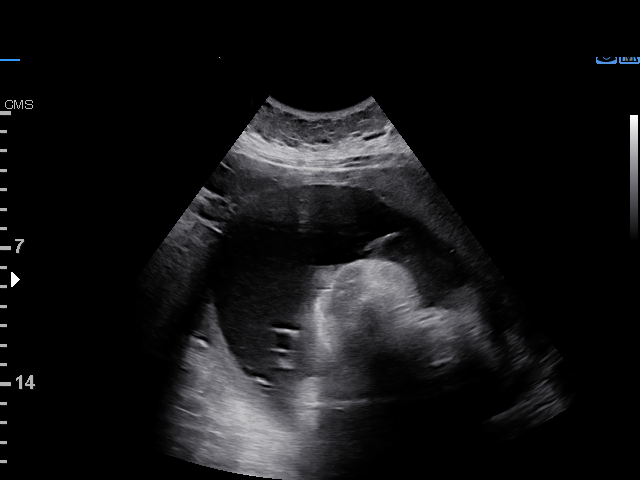
[im 14/53]
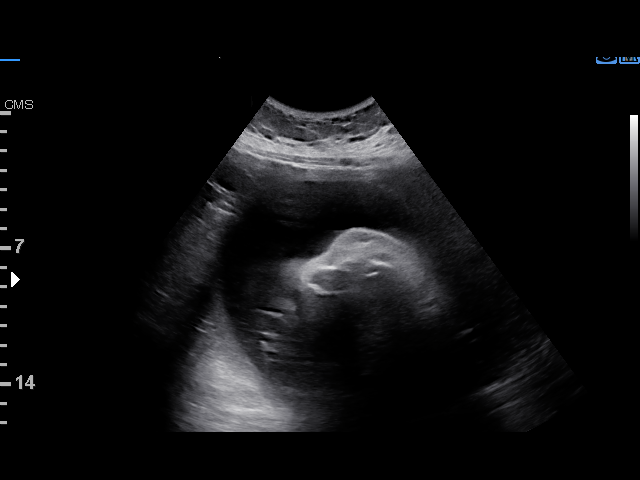
[im 18/53]
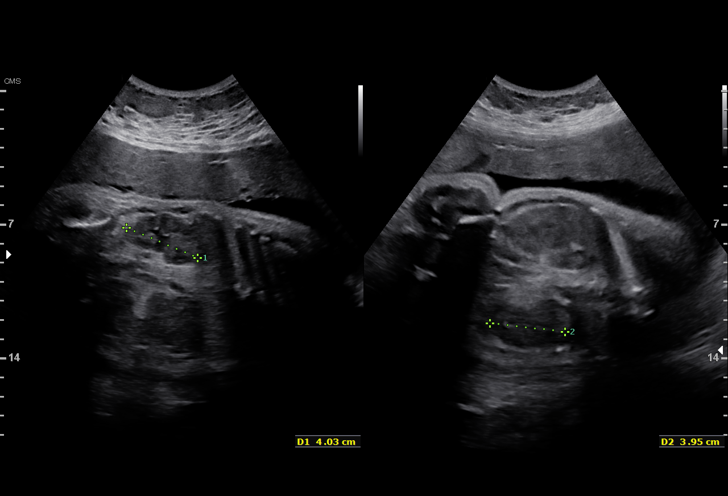
[im 22/53]
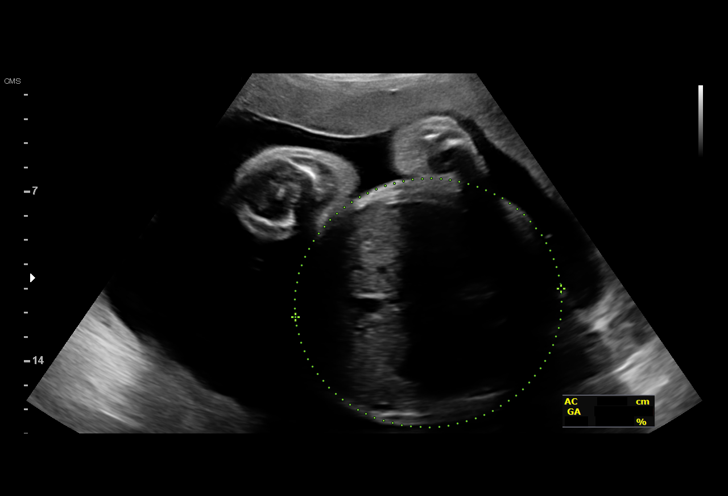
[im 26/53]
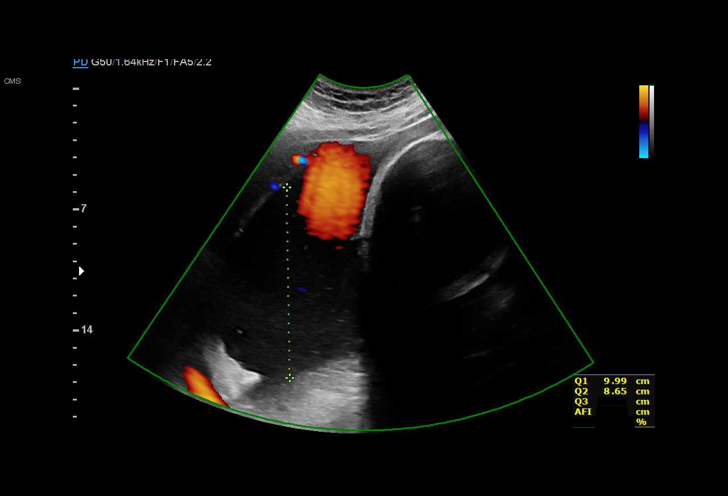
[im 29/53]
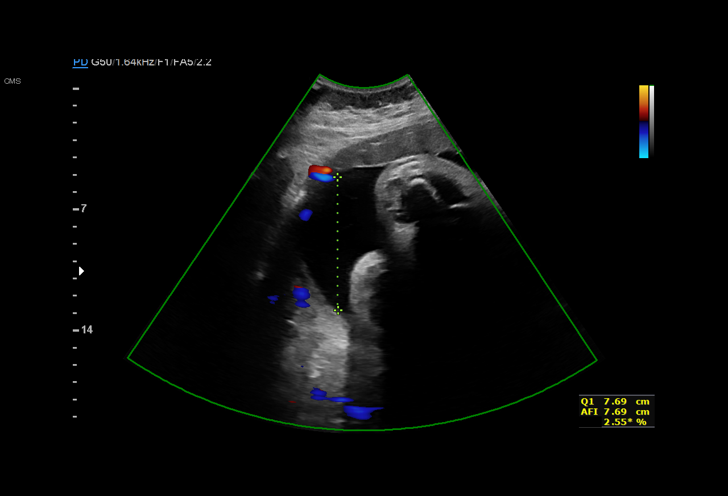
[im 33/53]
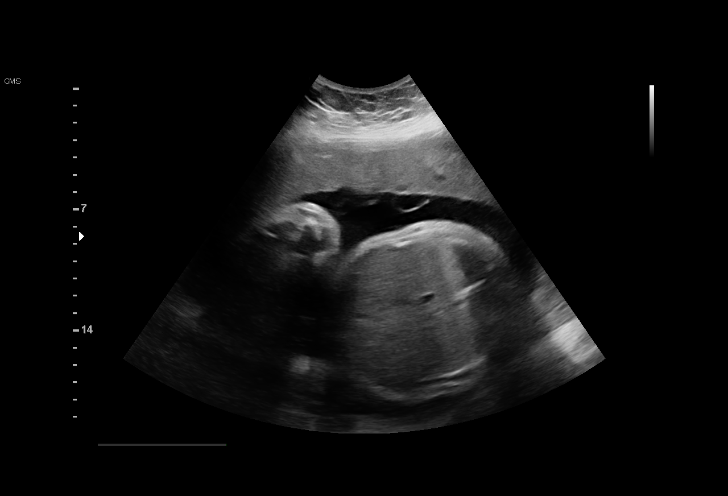
[im 37/53]
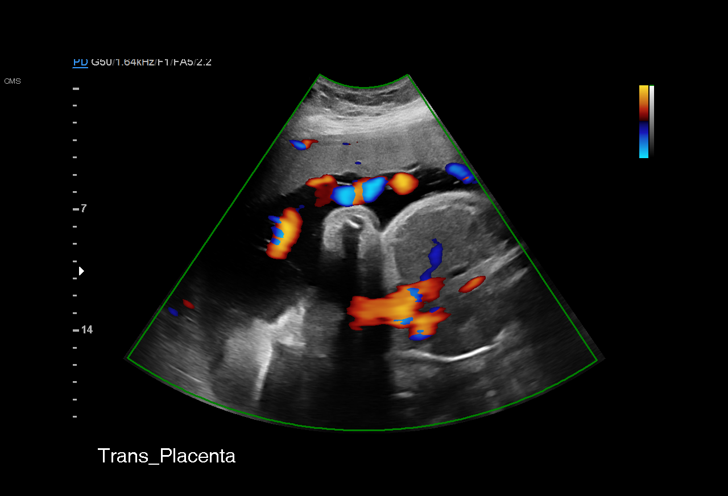
[im 41/53]
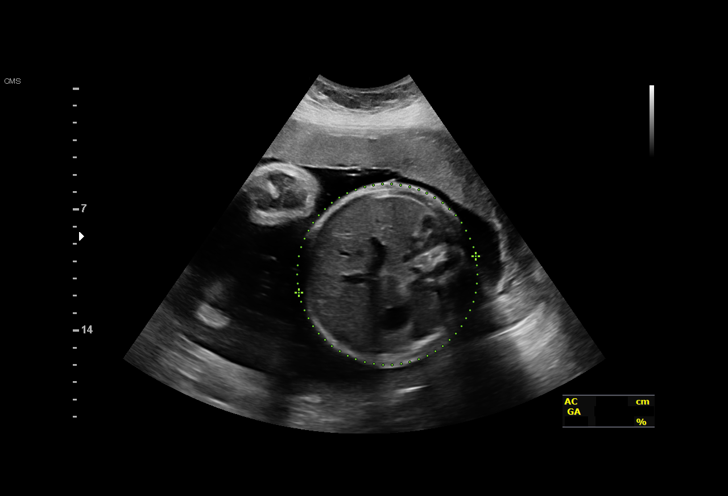
[im 45/53]
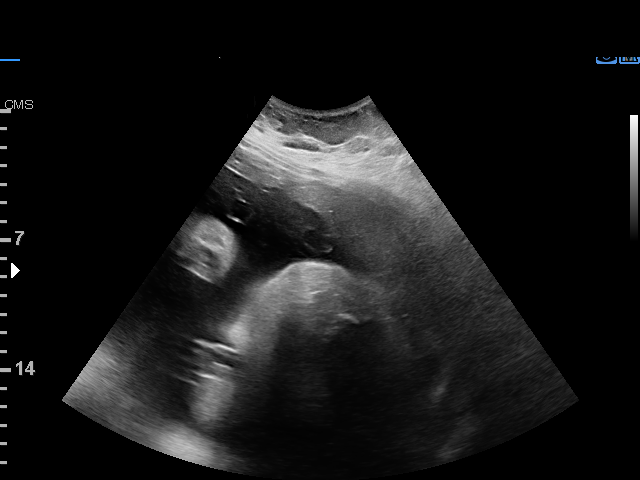
[im 49/53]
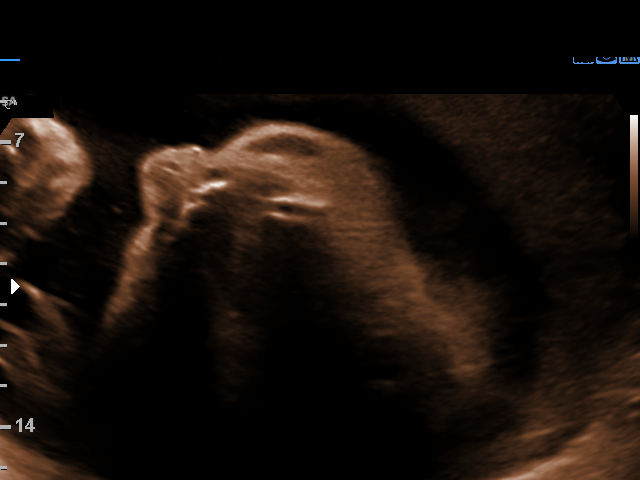
[im 53/53]
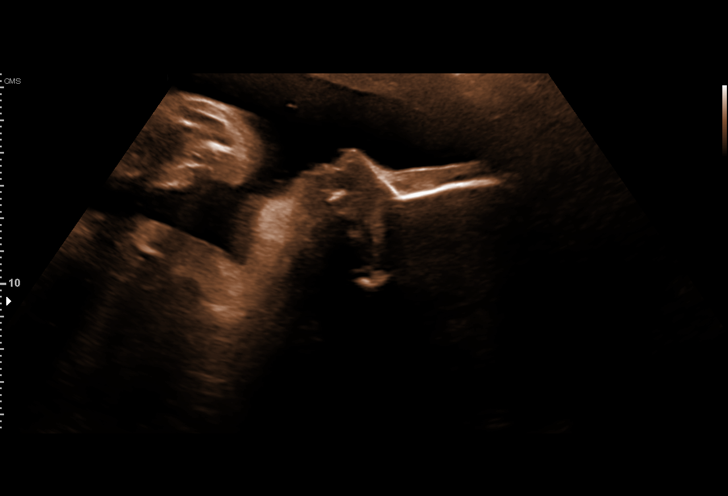

[14 of 28 positions shown; findings below may reference images not displayed]

----------------------------------------------------------------------

 ----------------------------------------------------------------------
Indications

  Encounter for other antenatal screening        [MY]
  follow-up
  31 weeks gestation of pregnancy
  Genetic carrier (specify) Pt withTreacher      [MY]
  PATRIARCHE Syndrome
  Hypertension - Gestational vs CHTN             [MY]
  Maternal morbid obesity                        [MY] [MY]
  Low risk NIPS
  Gestational diabetes in pregnancy, diet        [MY]
  controlled (new dx)
  Polyhydramnios, third trimester, antepartum    [MY]
  condition or complication, unspecified fetus
 ----------------------------------------------------------------------
Vital Signs

                                                Height:        4'11"
Fetal Evaluation

 Num Of Fetuses:         1
 Fetal Heart Rate(bpm):  142
 Cardiac Activity:       Observed
 Presentation:           Cephalic
 Placenta:               Anterior
 P. Cord Insertion:      Visualized

 Amniotic Fluid
 AFI FV:      Polyhydramnios

 AFI Sum(cm)     %Tile       Largest Pocket(cm)
 36.41           > 97
 RUQ(cm)       RLQ(cm)       LUQ(cm)        LLQ(cm)

Biometry

 BPD:      84.1  mm     G. Age:  33w 6d         97  %    CI:        74.19   %    70 - 86
                                                         FL/HC:      20.2   %    19.3 -
 HC:       310   mm     G. Age:  34w 4d         94  %    HC/AC:      0.93        0.96 -
 AC:      333.7  mm     G. Age:  37w 2d       > 97  %    FL/BPD:     74.4   %    71 - 87
 FL:       62.6  mm     G. Age:  32w 3d         71  %    FL/AC:      18.8   %    20 - 24
 HUM:      55.5  mm     G. Age:  32w 2d         75  %

 Est. FW:    [MY]  gm    5 lb 14 oz    > 90  %
OB History

 Gravidity:    1
Gestational Age

 Clinical EDD:  31w 1d                                        EDD:   [DATE]
 U/S Today:     34w 4d                                        EDD:   [DATE]
 Best:          31w 1d     Det. By:  Clinical EDD             EDD:   [DATE]
Anatomy

 Cranium:               Appears normal         LVOT:                   Previously seen
 Cavum:                 Appears normal         Aortic Arch:            Previously seen
 Ventricles:            Appears normal         Ductal Arch:            Previously seen
 Choroid Plexus:        Previously seen        Diaphragm:              Appears normal
 Cerebellum:            Previously seen        Stomach:                Appears normal, left
                                                                       sided
 Posterior Fossa:       Previously seen        Abdomen:                Appears normal
 Nuchal Fold:           Previously seen        Abdominal Wall:         Previously seen
 Face:                  Orbits and profile     Cord Vessels:           Previously seen
                        previously seen
 Lips:                  Left cleft lip         Kidneys:                Appear normal
 Palate:                Not well visualized    Bladder:                Appears normal
 Thoracic:              Appears normal         Spine:                  Previously seen
 Heart:                 Previously seen        Upper Extremities:      Previously seen
 RVOT:                  Previously seen        Lower Extremities:      Previously seen

 Other:  Nasal bone previously visualized.
Cervix Uterus Adnexa

 Cervix
 Not visualized (advanced GA >[MY])

 Uterus
 No abnormality visualized.

 Left Ovary
 Not visualized.

 Right Ovary
 Not visualized.

 Cul De Sac
 No free fluid seen.
 Adnexa
 No abnormality visualized.
Impression

 Live preterm gestation with left sided cleft lip and palate and
 polyhydramnios.
Recommendations

 Commence weekly fetal surveillance from next week due to
 maternal gestational diabetes and pregestational
 hypertension.
 Consider antenatal referral to Plastic surgeon.
 Delivery at 39 weeks unless otherwise indicated.
                PATRIARCHE

## 2018-09-21 ENCOUNTER — Other Ambulatory Visit (HOSPITAL_COMMUNITY): Payer: Self-pay | Admitting: *Deleted

## 2018-09-21 DIAGNOSIS — O409XX0 Polyhydramnios, unspecified trimester, not applicable or unspecified: Secondary | ICD-10-CM

## 2018-09-27 ENCOUNTER — Encounter (HOSPITAL_COMMUNITY): Payer: Self-pay

## 2018-09-27 ENCOUNTER — Ambulatory Visit (HOSPITAL_COMMUNITY)
Admission: RE | Admit: 2018-09-27 | Discharge: 2018-09-27 | Disposition: A | Discharge status: Home / Self Care | Payer: Medicaid Other | Source: Ambulatory Visit | Attending: Obstetrics & Gynecology | Admitting: Obstetrics & Gynecology

## 2018-09-27 DIAGNOSIS — Z148 Genetic carrier of other disease: Secondary | ICD-10-CM

## 2018-09-27 DIAGNOSIS — O24410 Gestational diabetes mellitus in pregnancy, diet controlled: Secondary | ICD-10-CM | POA: Diagnosis not present

## 2018-09-27 DIAGNOSIS — O133 Gestational [pregnancy-induced] hypertension without significant proteinuria, third trimester: Secondary | ICD-10-CM

## 2018-09-27 DIAGNOSIS — O0990 Supervision of high risk pregnancy, unspecified, unspecified trimester: Secondary | ICD-10-CM

## 2018-09-27 DIAGNOSIS — O409XX0 Polyhydramnios, unspecified trimester, not applicable or unspecified: Secondary | ICD-10-CM

## 2018-09-27 DIAGNOSIS — Z3A32 32 weeks gestation of pregnancy: Secondary | ICD-10-CM | POA: Diagnosis not present

## 2018-09-27 DIAGNOSIS — O99213 Obesity complicating pregnancy, third trimester: Secondary | ICD-10-CM | POA: Diagnosis not present

## 2018-09-27 DIAGNOSIS — O99210 Obesity complicating pregnancy, unspecified trimester: Secondary | ICD-10-CM

## 2018-09-27 IMAGING — US US MFM FETAL BPP W/O NON-STRESS
1 series · 14 of 28 positions shown · non-contrast
Comparison: none

[Series 1: us mfm fetal bpp w/o non-stress · 29 acquisitions, 14 frames shown]
[im 2/29]
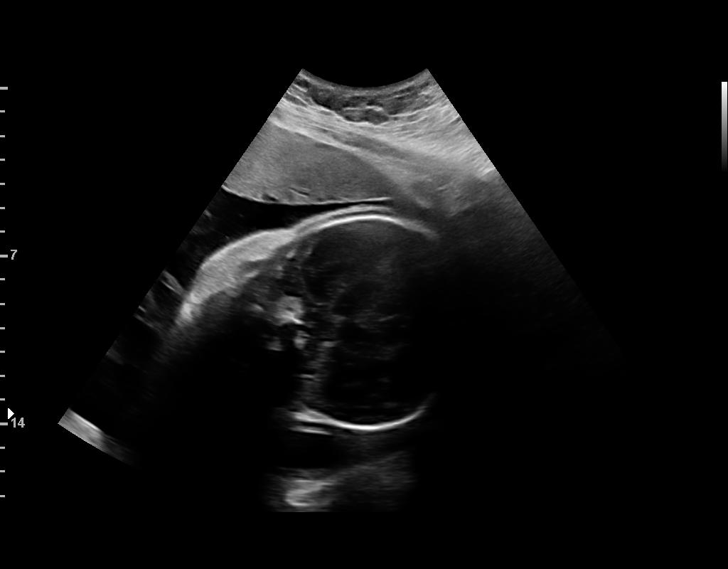
[im 4/29]
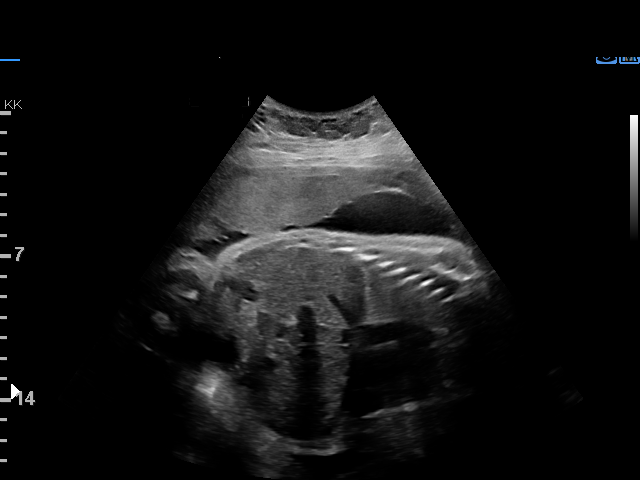
[im 6/29]
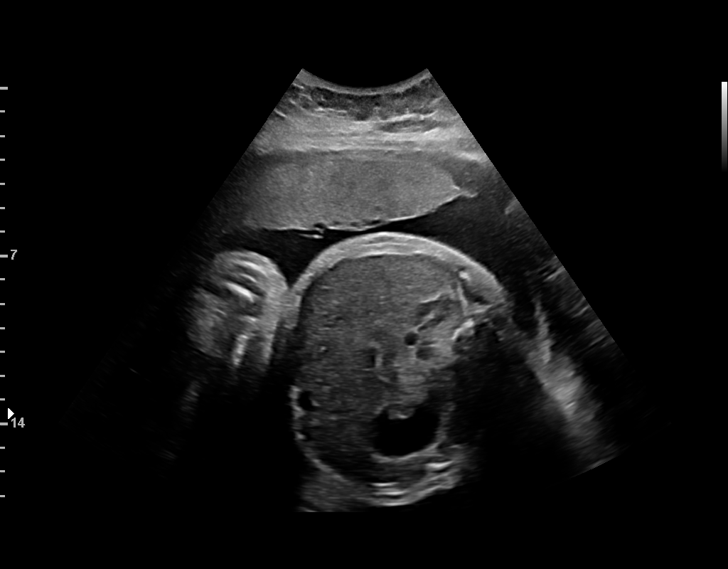
[im 8/29]
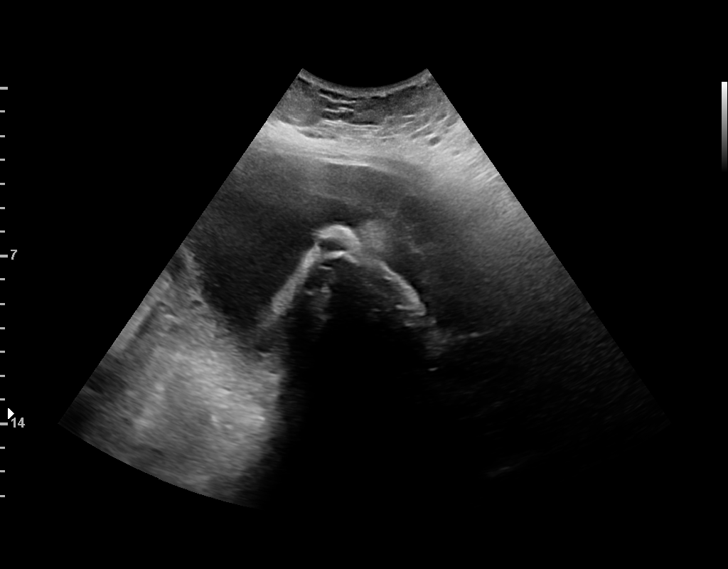
[im 10/29]
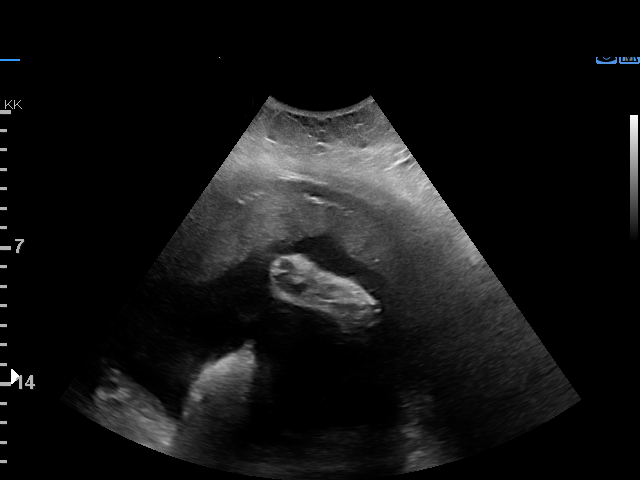
[im 12/29]
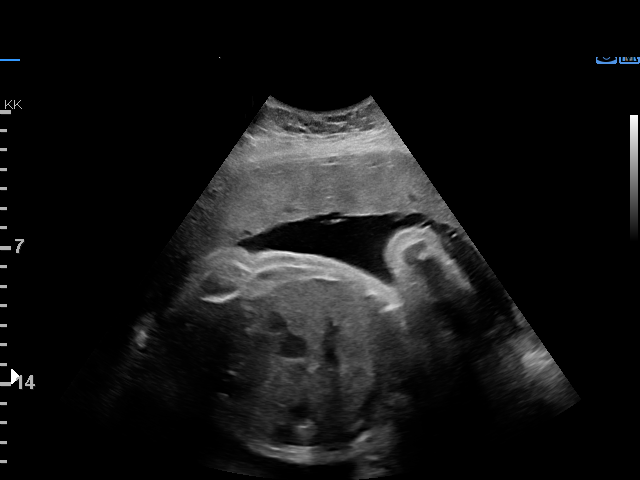
[im 14/29]
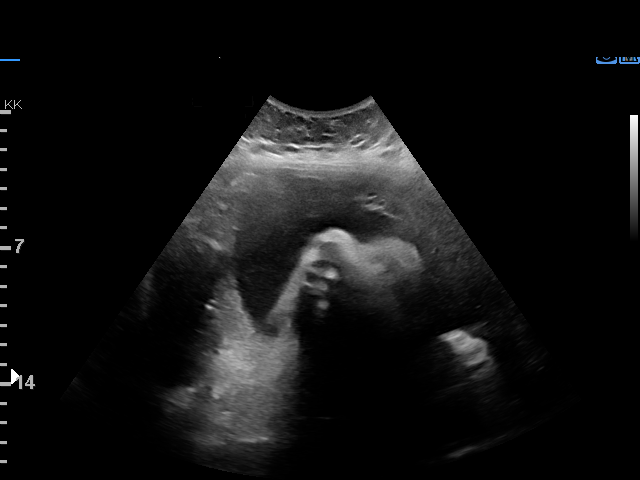
[im 16/29]
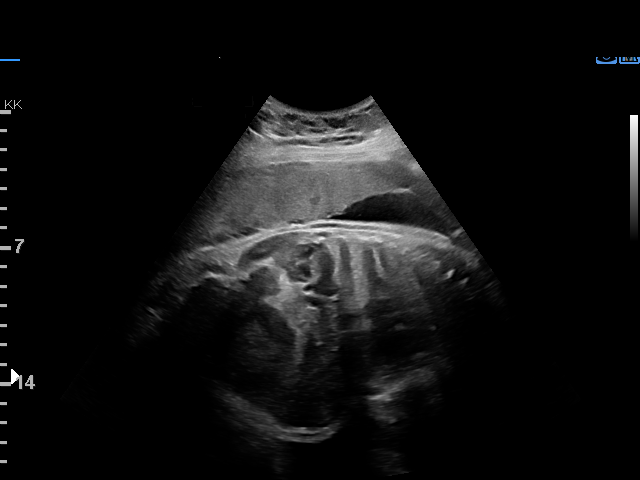
[im 18/29]
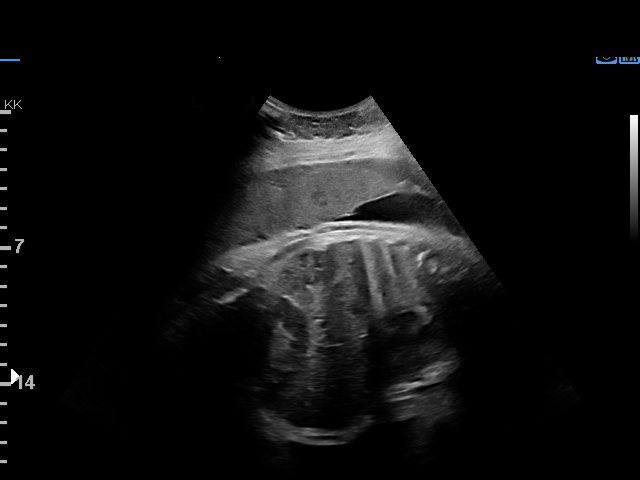
[im 20/29]
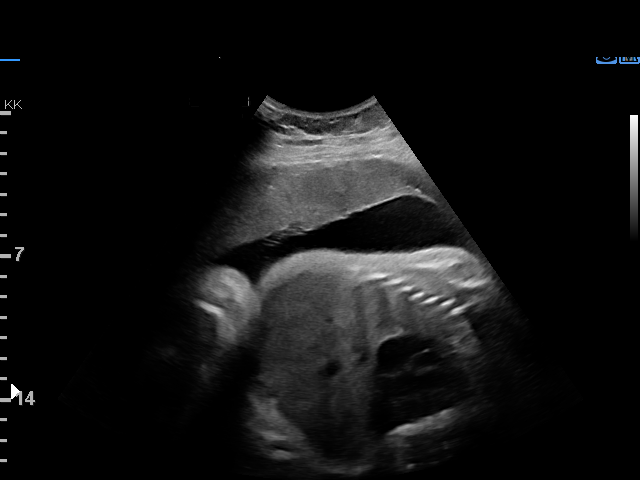
[im 22/29]
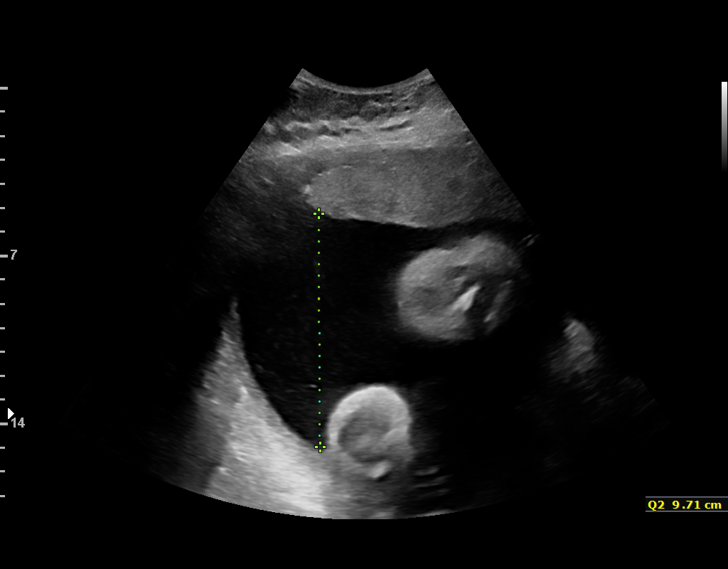
[im 24/29]
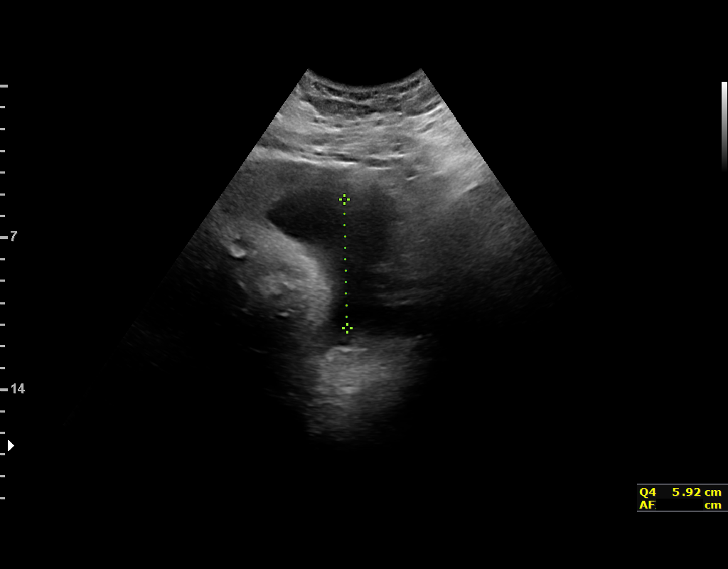
[im 26/29]
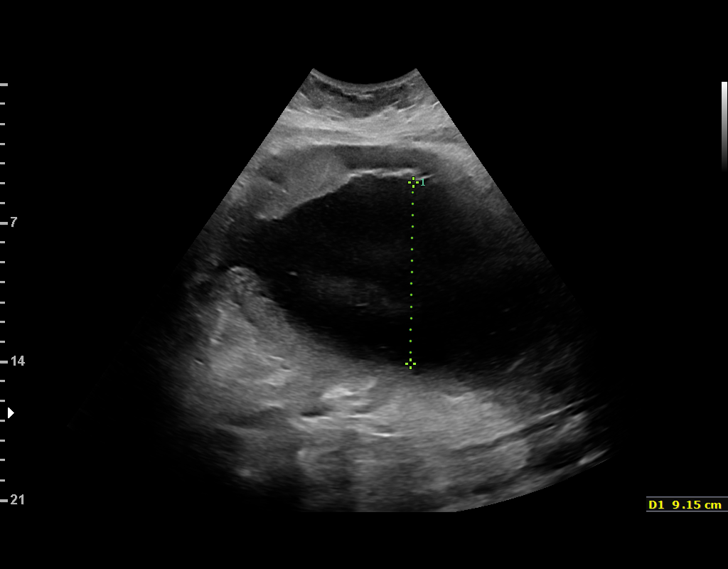
[im 29/29]
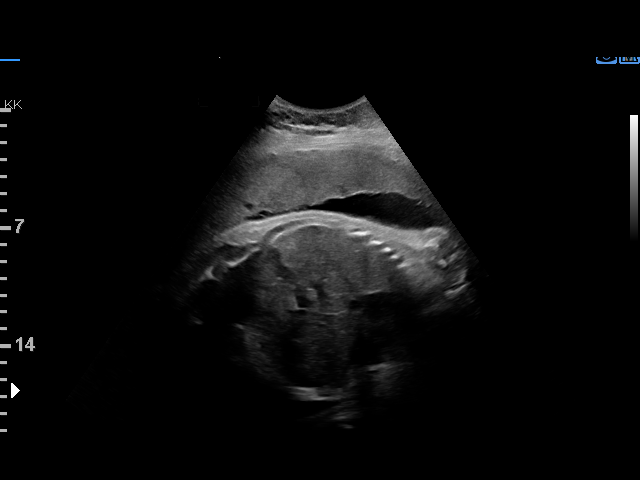

[14 of 28 positions shown; findings below may reference images not displayed]

----------------------------------------------------------------------

 ----------------------------------------------------------------------
Indications

  Genetic carrier (specify) Pt withTreacher      [42]
  TC RECAI Syndrome
  Hypertension - Gestational vs CHTN             [42]
  Maternal morbid obesity                        [42] [42]
  Low risk NIPS
  Gestational diabetes in pregnancy, diet        [42]
  controlled (new dx)
  32 weeks gestation of pregnancy
 ----------------------------------------------------------------------
Vital Signs

 BMI:
Fetal Evaluation

 Num Of Fetuses:         1
 Fetal Heart Rate(bpm):  145
 Cardiac Activity:       Observed
 Presentation:           Cephalic

 Amniotic Fluid
 AFI FV:      Polyhydramnios

 AFI Sum(cm)     %Tile       Largest Pocket(cm)
 31.82           > 97

 RUQ(cm)       RLQ(cm)       LUQ(cm)        LLQ(cm)

Biophysical Evaluation

 Amniotic F.V:   Polyhydramnios             F. Tone:        Observed
 F. Movement:    Observed                   Score:          [DATE]
 F. Breathing:   Observed
OB History

 Gravidity:    1
Gestational Age

 Clinical EDD:  32w 1d                                        EDD:   [DATE]
 Best:          32w 1d     Det. By:  Clinical EDD             EDD:   [DATE]
Comments

 U/S images reviewed. Findings reviewed with patient.   No
 evidence of fetal compromise is found on BPP today.  Patient
 had multiple questions regarding polyhydramnios and
 cleft lip/cleft lip and palate
 General counseling was then performed regarding
 polyhydramnios.  Congenital malformations, Fetal Hydrops,
 poorly controlled Diabetes and idiopathic polyhydramnios
 were discussed.  Approximately 50 % of cases are idiopathic.
 Polyhydramnios greater than 25 cm (2-3%) and greater than
 30 cm (4-6%) have been associated with chromosomal
 abnormalities.  The risks and benefits of amniocentesis
 including rupture of membranes, infection and fetal loss were
 discussed.  Polyhydramnios is also associated with increased
 risk for TC RECAI, perinatal morbidity and mortality.  A description
 of and the role of Biophysical Profile testing were discussed.
 General counseling regarding cleft lip/cleft palate was
 performed.  The multifactorial/polygenic nature of this
 condition was explained including the concepts of genes at
 risk, threshold and recurrence risk which is based on the
 number of first degree relatives affected as well as the
 severity of the disease.  With one affected first degree
 relative the recurrence risk is 2-5%.  High dose Folic acid
 (4mg/QD in subsequent pregnancies prior to and through the
 first trimester) may help in reducing recurrence if associated
 with low folic acid deficiency.

 When found with other anomalies, chromosomal
 abnormalities should be considered and ruled out.

  Questions answered.
 15 minutes spent face to face with patient.
 Recommendations: 1)  Weekly BPP 2) Serial U/S every 4
 weeks for fetal growth  3) Pediatric Facial surgery/ENT
 consultation
Recommendations

  1)  Weekly BPP 2) Serial U/S every 4 weeks for fetal growth
 3) Pediatric Facial surgery/ENT consultation

              TC RECAI

## 2018-09-27 NOTE — ED Notes (Signed)
Spoke with Dr. Katrinka BlazingSmith regarding Neonatology consult.  Dr. Katrinka BlazingSmith will call the patient and arrange consult time.

## 2018-09-30 ENCOUNTER — Encounter: Payer: Medicaid Other | Admitting: Obstetrics & Gynecology

## 2018-09-30 NOTE — Progress Notes (Deleted)
   Patient did not show up today for her scheduled appointment.   Annakate Soulier, MD, FACOG Obstetrician & Gynecologist, [facility], [facility]  

## 2018-10-04 ENCOUNTER — Ambulatory Visit (HOSPITAL_COMMUNITY): Admission: RE | Admit: 2018-10-04 | Payer: Medicaid Other | Source: Ambulatory Visit

## 2018-10-11 ENCOUNTER — Ambulatory Visit (HOSPITAL_COMMUNITY): Admission: RE | Admit: 2018-10-11 | Payer: Medicaid Other | Source: Ambulatory Visit

## 2018-10-18 ENCOUNTER — Encounter (HOSPITAL_COMMUNITY): Payer: Self-pay

## 2018-10-18 ENCOUNTER — Ambulatory Visit (HOSPITAL_COMMUNITY)
Admission: RE | Admit: 2018-10-18 | Discharge: 2018-10-18 | Disposition: A | Discharge status: Home / Self Care | Payer: Medicaid Other | Source: Ambulatory Visit | Attending: Obstetrics & Gynecology | Admitting: Obstetrics & Gynecology

## 2018-10-18 DIAGNOSIS — O99213 Obesity complicating pregnancy, third trimester: Secondary | ICD-10-CM

## 2018-10-18 DIAGNOSIS — O0990 Supervision of high risk pregnancy, unspecified, unspecified trimester: Secondary | ICD-10-CM | POA: Diagnosis present

## 2018-10-18 DIAGNOSIS — O358XX0 Maternal care for other (suspected) fetal abnormality and damage, not applicable or unspecified: Secondary | ICD-10-CM | POA: Diagnosis not present

## 2018-10-18 DIAGNOSIS — O139 Gestational [pregnancy-induced] hypertension without significant proteinuria, unspecified trimester: Secondary | ICD-10-CM

## 2018-10-18 DIAGNOSIS — Z3A35 35 weeks gestation of pregnancy: Secondary | ICD-10-CM

## 2018-10-18 DIAGNOSIS — O409XX0 Polyhydramnios, unspecified trimester, not applicable or unspecified: Secondary | ICD-10-CM | POA: Diagnosis not present

## 2018-10-18 DIAGNOSIS — O99210 Obesity complicating pregnancy, unspecified trimester: Secondary | ICD-10-CM | POA: Diagnosis present

## 2018-10-18 DIAGNOSIS — O24410 Gestational diabetes mellitus in pregnancy, diet controlled: Secondary | ICD-10-CM

## 2018-10-18 IMAGING — US US MFM FETAL BPP W/O NON-STRESS
1 series · 13 of 26 positions shown · non-contrast
Comparison: none

[Series 1: us mfm fetal bpp w/o non-stress · 26 acquisitions, 13 frames shown]
[im 2/26]
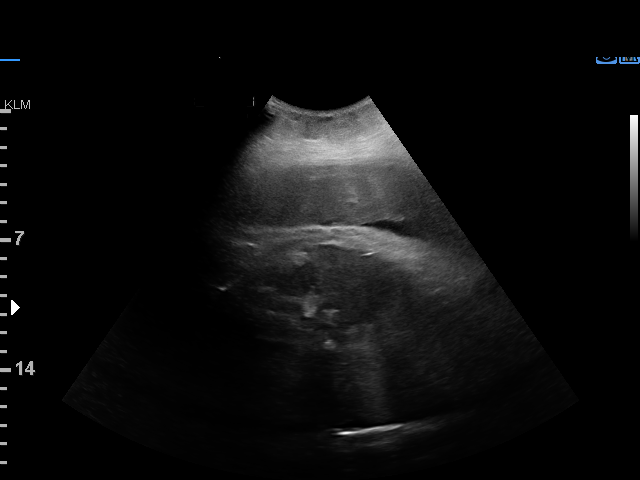
[im 4/26]
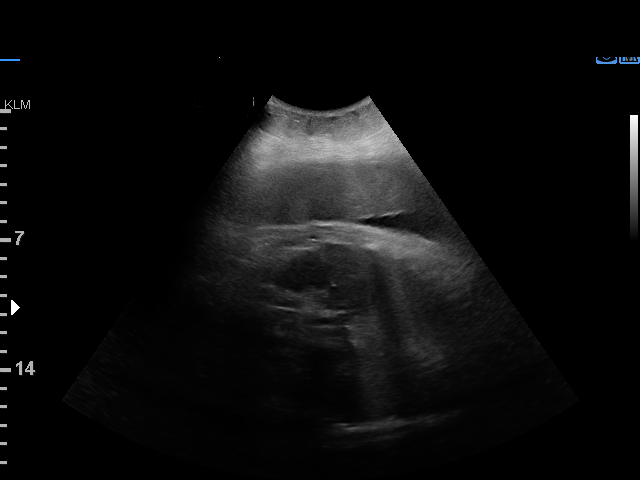
[im 6/26]
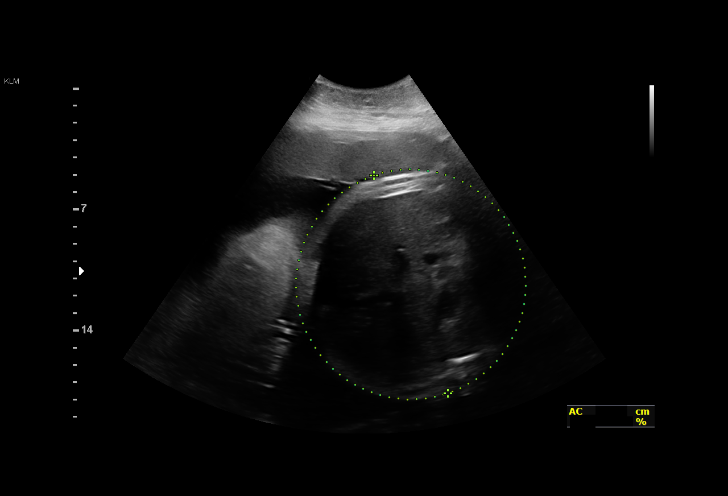
[im 8/26]
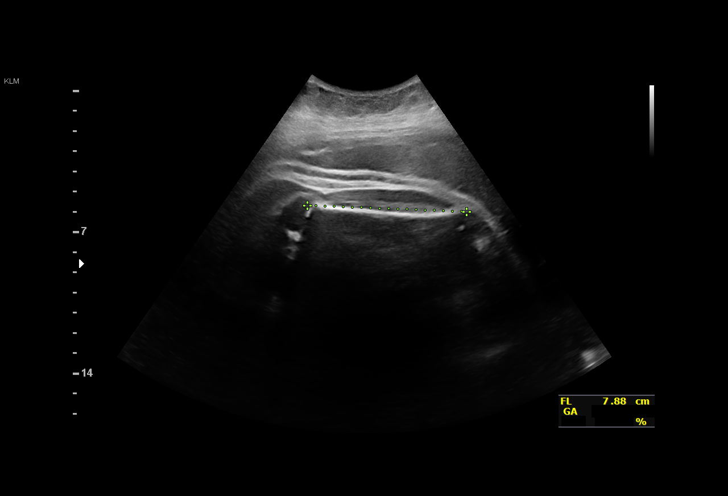
[im 10/26]
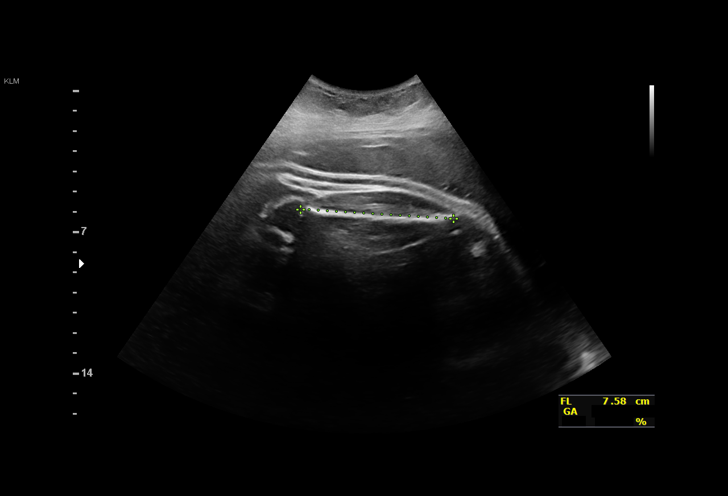
[im 12/26]
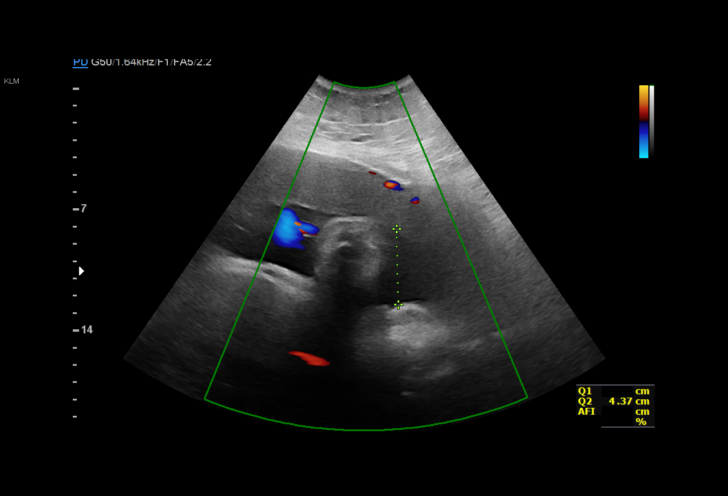
[im 14/26]
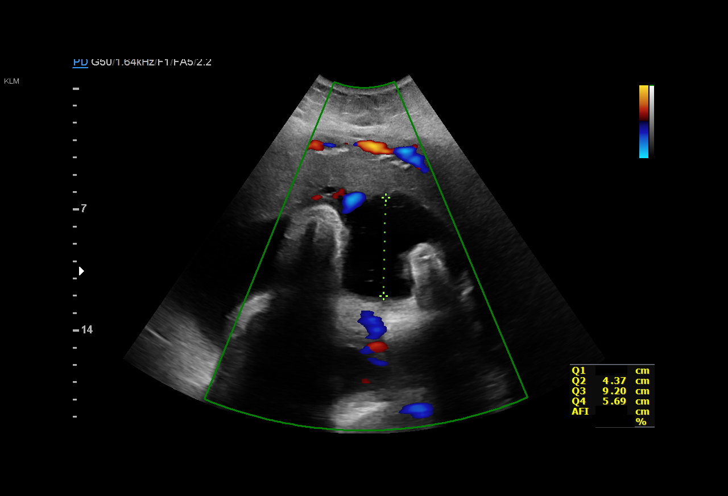
[im 16/26]
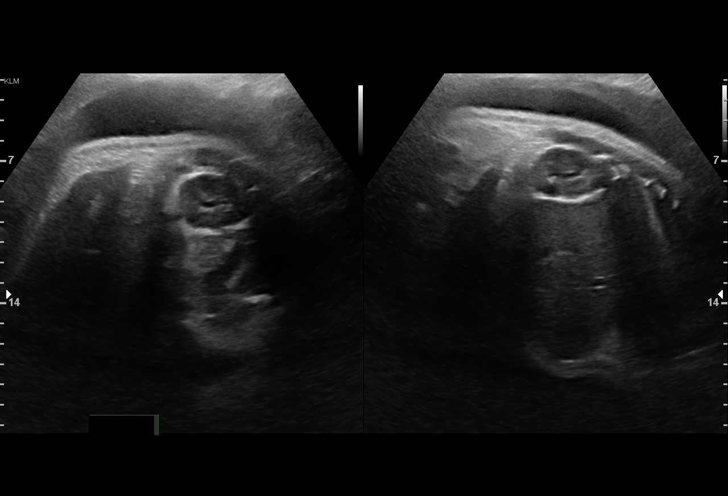
[im 18/26]
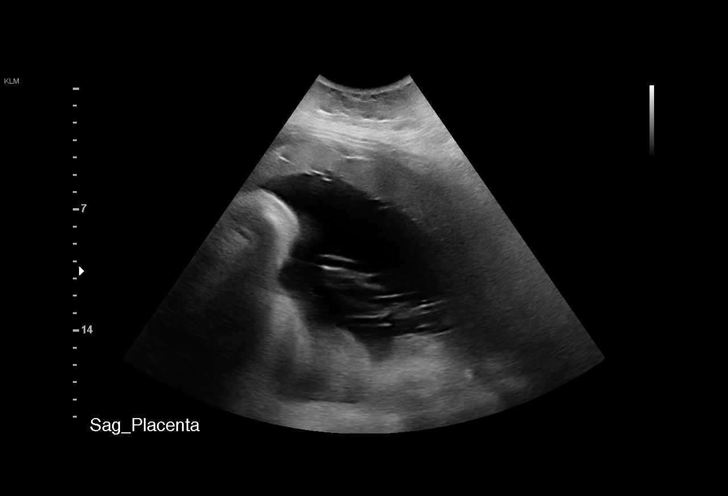
[im 20/26]
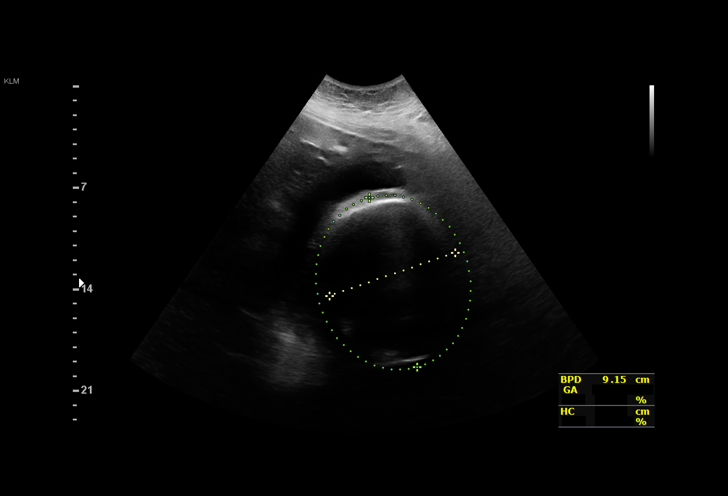
[im 22/26]
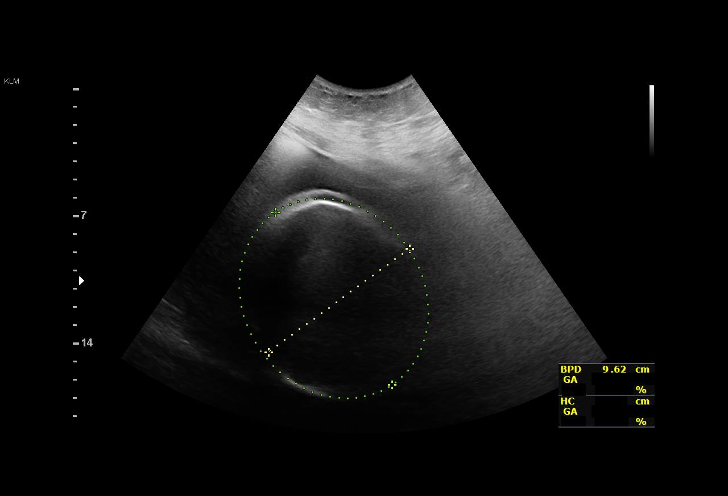
[im 24/26]
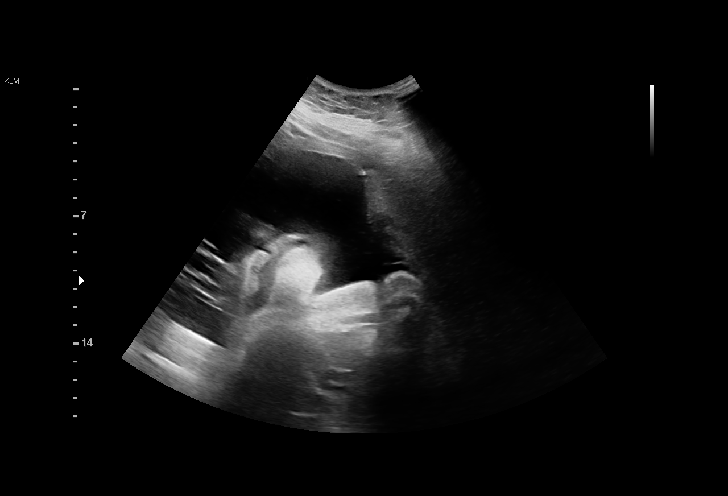
[im 26/26]
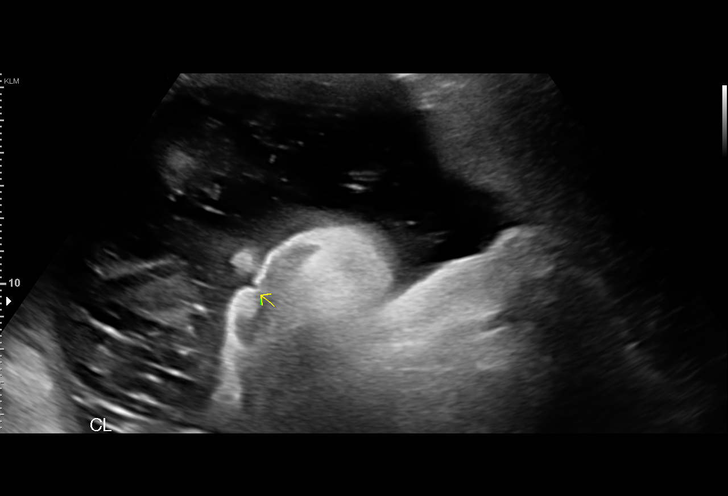

[13 of 26 positions shown; findings below may reference images not displayed]

----------------------------------------------------------------------

 ----------------------------------------------------------------------
Indications

  Genetic carrier (specify) Pt withTreacher      [LK]
  OPEYEMI Syndrome
  35 weeks gestation of pregnancy
  Hypertension - Gestational vs CHTN             [LK]
  Maternal morbid obesity                        [LK] [LK]
  Low risk NIPS
  Gestational diabetes in pregnancy, diet        [LK]
  controlled (new dx)
  Cleft lip, unilateral                          [LK]
 ----------------------------------------------------------------------
Vital Signs

                                                Height:        4'11"
Fetal Evaluation

 Num Of Fetuses:         1
 Fetal Heart Rate(bpm):  142
 Cardiac Activity:       Observed
 Presentation:           Cephalic

 Amniotic Fluid
 AFI FV:      Polyhydramnios

 AFI Sum(cm)     %Tile       Largest Pocket(cm)
 29.68           > 97

 RUQ(cm)       RLQ(cm)       LUQ(cm)        LLQ(cm)

Biophysical Evaluation

 Amniotic F.V:   Polyhydramnios             F. Tone:        Observed
 F. Movement:    Observed                   Score:          [DATE]
 F. Breathing:   Observed
Biometry

 BPD:      95.2  mm     G. Age:  38w 6d       > 99  %    CI:        75.86   %    70 - 86
                                                         FL/HC:      22.6   %    20.1 -
 HC:      346.5  mm     G. Age:  40w 1d       > 97  %    HC/AC:      0.84        0.93 -
 AC:      413.5  mm     G. Age:  N/A          > 97  %    FL/BPD:     82.4   %    71 - 87
 FL:       78.4  mm     G. Age:  40w 1d       > 97  %    FL/AC:      19.0   %    20 - 24

 Est. FW:    [LK]  gm      10 lb 12    > 90  %
                                 oz
OB History

 Gravidity:    1
Gestational Age

 Clinical EDD:  35w 1d                                        EDD:   [DATE]
 U/S Today:     39w 5d                                        EDD:   [DATE]
 Best:          35w 1d     Det. By:  Clinical EDD             EDD:   [DATE]
Anatomy

 Cranium:               Appears normal         LVOT:                   Previously seen
 Cavum:                 Previously seen        Aortic Arch:            Previously seen
 Ventricles:            Previously seen        Ductal Arch:            Previously seen
 Choroid Plexus:        Previously seen        Diaphragm:              Appears normal
 Cerebellum:            Previously seen        Stomach:                Appears normal, left
                                                                       sided
 Posterior Fossa:       Previously seen        Abdomen:                Appears normal
 Nuchal Fold:           Previously seen        Abdominal Wall:         Previously seen
 Face:                  Orbits and profile     Cord Vessels:           Previously seen
                        previously seen
 Lips:                  Left cleft lip         Kidneys:                Appear normal
 Palate:                Not well visualized    Bladder:                Appears normal
 Thoracic:              Appears normal         Spine:                  Previously seen
 Heart:                 Previously seen        Upper Extremities:      Previously seen
 RVOT:                  Previously seen        Lower Extremities:      Previously seen

 Other:  Nasal bone previously visualized.
Cervix Uterus Adnexa

 Cervix
 Not visualized (advanced GA >[LK])
Impression

 Gestational diabetes. Patient reports her blood glucose levels
 are within normal range. I did not review the log.
 Chronic hypertension: Not taking antihypertensives.
 Unilateral cleft lip.
 Polyhydramnios.
 On ultrasound, amniotic fluid is increased (AFI=30 cm). Good
 fetal activity is seen. The estimated fetal weight is 4,870 g
 (>90th percentile). Antenatal testing is reassuring. A subtle
 unilateral cleft lip is seen.
 I explained the significance of fetal macrosomia and diabetes
 and that the likelihood of shoulder dystocia is increased at
 birth. I also informed her that ultrasound has limitations in
 accurately-estimating fetal weights.
 I recommended elective cesarean delivery and the patient
 also preferred to have cesarean delivery.
 Timing of delivery: Polyhydramnios and macrosomia reflect
 fetopathy and possibly, suboptimal control. I recommend
 delivery at 37 to 38 weeks (37 weeks if blood glucose log
 shows poor or suboptimal control, if patient is not checking
 her blood glucose regularly).
 Patient is planning to have cleft lip repair at [HOSPITAL], [facility].
Recommendations

 -We made an appointment for her to return next week for
 antenatal testing and NICU consultation.
                 [facility]YEMI

## 2018-10-18 NOTE — ED Notes (Signed)
Patient still needs NICU consult. Patient usually schedules her appts at [facility] at 4:00PM. Unable to coordinate NICU consult at that time. On 11/01/18, patient has appt at 1:30. After speaking with Dr. Mikle Bosworth, appt was made for NICU consult at 2:00pm or upon completion of U/S on that day.

## 2018-10-19 ENCOUNTER — Ambulatory Visit (INDEPENDENT_AMBULATORY_CARE_PROVIDER_SITE_OTHER): Payer: Medicaid Other | Admitting: Obstetrics and Gynecology

## 2018-10-19 ENCOUNTER — Encounter (HOSPITAL_COMMUNITY): Payer: Self-pay

## 2018-10-19 ENCOUNTER — Other Ambulatory Visit (HOSPITAL_COMMUNITY): Payer: Self-pay | Admitting: *Deleted

## 2018-10-19 VITALS — BP 153/89 | HR 92 | Wt 311.0 lb

## 2018-10-19 DIAGNOSIS — O3663X0 Maternal care for excessive fetal growth, third trimester, not applicable or unspecified: Secondary | ICD-10-CM

## 2018-10-19 DIAGNOSIS — O99213 Obesity complicating pregnancy, third trimester: Secondary | ICD-10-CM

## 2018-10-19 DIAGNOSIS — O0993 Supervision of high risk pregnancy, unspecified, third trimester: Secondary | ICD-10-CM

## 2018-10-19 DIAGNOSIS — Z91199 Patient's noncompliance with other medical treatment and regimen due to unspecified reason: Secondary | ICD-10-CM

## 2018-10-19 DIAGNOSIS — O24419 Gestational diabetes mellitus in pregnancy, unspecified control: Secondary | ICD-10-CM

## 2018-10-19 DIAGNOSIS — O0990 Supervision of high risk pregnancy, unspecified, unspecified trimester: Secondary | ICD-10-CM

## 2018-10-19 DIAGNOSIS — O409XX0 Polyhydramnios, unspecified trimester, not applicable or unspecified: Secondary | ICD-10-CM | POA: Insufficient documentation

## 2018-10-19 DIAGNOSIS — Q369 Cleft lip, unilateral: Secondary | ICD-10-CM

## 2018-10-19 DIAGNOSIS — O3660X0 Maternal care for excessive fetal growth, unspecified trimester, not applicable or unspecified: Secondary | ICD-10-CM

## 2018-10-19 DIAGNOSIS — Z9119 Patient's noncompliance with other medical treatment and regimen: Secondary | ICD-10-CM

## 2018-10-19 DIAGNOSIS — O99210 Obesity complicating pregnancy, unspecified trimester: Secondary | ICD-10-CM

## 2018-10-19 DIAGNOSIS — O133 Gestational [pregnancy-induced] hypertension without significant proteinuria, third trimester: Secondary | ICD-10-CM

## 2018-10-19 DIAGNOSIS — O403XX0 Polyhydramnios, third trimester, not applicable or unspecified: Secondary | ICD-10-CM

## 2018-10-19 NOTE — Progress Notes (Signed)
Prenatal Visit Note Date: 10/19/2018 Clinic: [facility]  Subjective:  Suzanne Frederick is a 23 y.o. G1P0 at [redacted]w[redacted]d being seen today for ongoing prenatal care.  She is currently monitored for the following issues for this high-risk pregnancy and has Supervision of high risk pregnancy, antepartum; ADHD (attention deficit hyperactivity disorder); Depression; Menorrhagia; Headache; Treacher Collins syndrome; Obesity complicating peripregnancy, antepartum; Nausea and vomiting of pregnancy, antepartum; BMI 50.0-59.9, adult (HCC); Treat as a patient with Chronic HTN; Abnormal glucose affecting pregnancy; and GDM (gestational diabetes mellitus) on their problem list.  Patient reports no complaints.   Contractions: Not present. Vag. Bleeding: None.  Movement: Present. Denies leaking of fluid.   The following portions of the patient's history were reviewed and updated as appropriate: allergies, current medications, past family history, past medical history, past social history, past surgical history and problem list. Problem list updated.  Objective:   Vitals:   10/19/18 1121  BP: (!) 153/89  Pulse: 92  Weight: (!) 311 lb (141.1 kg)    Fetal Status: Fetal Heart Rate (bpm): 134   Movement: Present     General:  Alert, oriented and cooperative. Patient is in no acute distress.  Skin: Skin is warm and dry. No rash noted.   Cardiovascular: Normal heart rate noted  Respiratory: Normal respiratory effort, no problems with respiration noted  Abdomen: Soft, gravid, appropriate for gestational age. Pain/Pressure: Present     Pelvic:  Cervical exam deferred        Extremities: Normal range of motion.  Edema: Deep pitting, indentation remains for a short time  Mental Status: Normal mood and affect. Normal behavior. Normal judgment and thought content.   Urinalysis:      Assessment and Plan:  Pregnancy: G1P0 at [redacted]w[redacted]d  1. Chronic hypertension of pregnancy in third trimester No  severe s/s. Continue to follow closely  2. Supervision of high risk pregnancy, antepartum See below. D/w pt re: BC nv - Hemoglobin A1c - Glucose, random  3. Obesity complicating peripregnancy, antepartum  4. Gestational diabetes mellitus (GDM), antepartum, gestational diabetes method of control unspecified Patient with poly and LGA fetus on u/s yesterday. Patient didn't bring log book or meter today and unable to state values. Had hot dog and regular soda for breakfast; I told her to avoid foods like this for BS control. Recommendation for 37wk delivery made by mfm. D/w her re: importance of BS control and maternal fetal complications with IUFD risk for uncontrolled BS. BS meter in clinic not working. Will check a1c and lab CBG today and bring her back on Friday. I told her that if she isn't checking her sugars or values are high then I'd recommend admission to the hospital. AM fasting and 2h post prandial value goals d/w her. BPP 8/8 yesterday and efw 4870gm. Pt desires primary c-section on 1/20. Request sent for 1/20. Continue with weekly testing.  - Hemoglobin A1c - Glucose, random  Preterm labor symptoms and general obstetric precautions including but not limited to vaginal bleeding, contractions, leaking of fluid and fetal movement were reviewed in detail with the patient. Please refer to After Visit Summary for other counseling recommendations.  Return in about 3 days (around 10/22/2018) for BS check.   [place] Bing, MD

## 2018-10-20 LAB — HEMOGLOBIN A1C
Est. average glucose Bld gHb Est-mCnc: 143 mg/dL
Hgb A1c MFr Bld: 6.6 % — ABNORMAL HIGH (ref 4.8–5.6)

## 2018-10-20 LAB — GLUCOSE, RANDOM: Glucose: 149 mg/dL — ABNORMAL HIGH (ref 65–99)

## 2018-10-21 ENCOUNTER — Telehealth: Payer: Self-pay

## 2018-10-21 ENCOUNTER — Encounter: Payer: Medicaid Other | Admitting: Obstetrics and Gynecology

## 2018-10-21 NOTE — Telephone Encounter (Signed)
Patient missed her appointment today. Called to check on her to see how she was feeling and her blood sugars. She answer the phone and I told her,  I was calling from [employer] and she hung up the phone. Called patient again and her voice mail pick up. I left a detail message advising patient to please call us back regarding her blood sugars and that is was very important that we hear from her today. If she had any trouble getting a hold of [place] she could report to [facility]( [facility] with any concerns.

## 2018-10-25 ENCOUNTER — Inpatient Hospital Stay (HOSPITAL_COMMUNITY)
Admission: AD | Admit: 2018-10-25 | Discharge: 2018-10-29 | DRG: 787 | Disposition: A | Discharge status: Home / Self Care | Payer: Medicaid Other | Attending: Family Medicine | Admitting: Family Medicine

## 2018-10-25 ENCOUNTER — Inpatient Hospital Stay (EMERGENCY_DEPARTMENT_HOSPITAL)
Admission: AD | Admit: 2018-10-25 | Discharge: 2018-10-25 | Disposition: A | Discharge status: Home / Self Care | Payer: Medicaid Other | Source: Home / Self Care | Attending: Obstetrics and Gynecology | Admitting: Obstetrics and Gynecology

## 2018-10-25 ENCOUNTER — Telehealth: Payer: Self-pay | Admitting: *Deleted

## 2018-10-25 ENCOUNTER — Encounter (HOSPITAL_COMMUNITY): Payer: Self-pay

## 2018-10-25 ENCOUNTER — Other Ambulatory Visit: Payer: Self-pay

## 2018-10-25 DIAGNOSIS — O24419 Gestational diabetes mellitus in pregnancy, unspecified control: Secondary | ICD-10-CM | POA: Insufficient documentation

## 2018-10-25 DIAGNOSIS — Q754 Mandibulofacial dysostosis: Secondary | ICD-10-CM

## 2018-10-25 DIAGNOSIS — O99334 Smoking (tobacco) complicating childbirth: Secondary | ICD-10-CM | POA: Diagnosis present

## 2018-10-25 DIAGNOSIS — O403XX0 Polyhydramnios, third trimester, not applicable or unspecified: Secondary | ICD-10-CM | POA: Insufficient documentation

## 2018-10-25 DIAGNOSIS — O1413 Severe pre-eclampsia, third trimester: Secondary | ICD-10-CM | POA: Diagnosis present

## 2018-10-25 DIAGNOSIS — O99213 Obesity complicating pregnancy, third trimester: Secondary | ICD-10-CM | POA: Insufficient documentation

## 2018-10-25 DIAGNOSIS — F17200 Nicotine dependence, unspecified, uncomplicated: Secondary | ICD-10-CM | POA: Diagnosis present

## 2018-10-25 DIAGNOSIS — O3663X0 Maternal care for excessive fetal growth, third trimester, not applicable or unspecified: Secondary | ICD-10-CM | POA: Diagnosis present

## 2018-10-25 DIAGNOSIS — O3660X0 Maternal care for excessive fetal growth, unspecified trimester, not applicable or unspecified: Secondary | ICD-10-CM | POA: Diagnosis present

## 2018-10-25 DIAGNOSIS — O1002 Pre-existing essential hypertension complicating childbirth: Secondary | ICD-10-CM | POA: Diagnosis present

## 2018-10-25 DIAGNOSIS — Z3A36 36 weeks gestation of pregnancy: Secondary | ICD-10-CM | POA: Insufficient documentation

## 2018-10-25 DIAGNOSIS — O358XX0 Maternal care for other (suspected) fetal abnormality and damage, not applicable or unspecified: Secondary | ICD-10-CM | POA: Diagnosis present

## 2018-10-25 DIAGNOSIS — O99333 Smoking (tobacco) complicating pregnancy, third trimester: Secondary | ICD-10-CM | POA: Insufficient documentation

## 2018-10-25 DIAGNOSIS — D696 Thrombocytopenia, unspecified: Secondary | ICD-10-CM | POA: Diagnosis present

## 2018-10-25 DIAGNOSIS — O0990 Supervision of high risk pregnancy, unspecified, unspecified trimester: Secondary | ICD-10-CM

## 2018-10-25 DIAGNOSIS — D649 Anemia, unspecified: Secondary | ICD-10-CM | POA: Diagnosis present

## 2018-10-25 DIAGNOSIS — O9912 Other diseases of the blood and blood-forming organs and certain disorders involving the immune mechanism complicating childbirth: Secondary | ICD-10-CM | POA: Diagnosis present

## 2018-10-25 DIAGNOSIS — O114 Pre-existing hypertension with pre-eclampsia, complicating childbirth: Principal | ICD-10-CM | POA: Diagnosis present

## 2018-10-25 DIAGNOSIS — O99210 Obesity complicating pregnancy, unspecified trimester: Secondary | ICD-10-CM

## 2018-10-25 DIAGNOSIS — O409XX0 Polyhydramnios, unspecified trimester, not applicable or unspecified: Secondary | ICD-10-CM | POA: Diagnosis present

## 2018-10-25 DIAGNOSIS — O119 Pre-existing hypertension with pre-eclampsia, unspecified trimester: Secondary | ICD-10-CM

## 2018-10-25 DIAGNOSIS — O99214 Obesity complicating childbirth: Secondary | ICD-10-CM | POA: Diagnosis present

## 2018-10-25 DIAGNOSIS — O24420 Gestational diabetes mellitus in childbirth, diet controlled: Secondary | ICD-10-CM | POA: Diagnosis present

## 2018-10-25 DIAGNOSIS — O9902 Anemia complicating childbirth: Secondary | ICD-10-CM | POA: Diagnosis present

## 2018-10-25 DIAGNOSIS — O479 False labor, unspecified: Secondary | ICD-10-CM

## 2018-10-25 LAB — CBC
HCT: 35.6 % — ABNORMAL LOW (ref 36.0–46.0)
Hemoglobin: 11 g/dL — ABNORMAL LOW (ref 12.0–15.0)
MCH: 27 pg (ref 26.0–34.0)
MCHC: 30.9 g/dL (ref 30.0–36.0)
MCV: 87.3 fL (ref 80.0–100.0)
NRBC: 0 % (ref 0.0–0.2)
Platelets: 154 10*3/uL (ref 150–400)
RBC: 4.08 MIL/uL (ref 3.87–5.11)
RDW: 15.8 % — AB (ref 11.5–15.5)
WBC: 9.5 10*3/uL (ref 4.0–10.5)

## 2018-10-25 LAB — COMPREHENSIVE METABOLIC PANEL
ALT: 12 U/L (ref 0–44)
AST: 17 U/L (ref 15–41)
Albumin: 2 g/dL — ABNORMAL LOW (ref 3.5–5.0)
Alkaline Phosphatase: 78 U/L (ref 38–126)
Anion gap: 8 (ref 5–15)
BUN: 9 mg/dL (ref 6–20)
CO2: 23 mmol/L (ref 22–32)
CREATININE: 0.61 mg/dL (ref 0.44–1.00)
Calcium: 8.6 mg/dL — ABNORMAL LOW (ref 8.9–10.3)
Chloride: 107 mmol/L (ref 98–111)
Glucose, Bld: 167 mg/dL — ABNORMAL HIGH (ref 70–99)
Potassium: 4.3 mmol/L (ref 3.5–5.1)
Sodium: 138 mmol/L (ref 135–145)
Total Bilirubin: 0.3 mg/dL (ref 0.3–1.2)
Total Protein: 5.7 g/dL — ABNORMAL LOW (ref 6.5–8.1)

## 2018-10-25 LAB — URINALYSIS, ROUTINE W REFLEX MICROSCOPIC
Bilirubin Urine: NEGATIVE
Glucose, UA: 50 mg/dL — AB
Hgb urine dipstick: NEGATIVE
Ketones, ur: NEGATIVE mg/dL
Nitrite: NEGATIVE
Protein, ur: 100 mg/dL — AB
Specific Gravity, Urine: 1.023 (ref 1.005–1.030)
Squamous Epithelial / HPF: >50 — ABNORMAL HIGH (ref 0–5)
WBC, UA: >50 WBC/hpf — ABNORMAL HIGH (ref 0–5)
pH: 5 (ref 5.0–8.0)

## 2018-10-25 LAB — PROTEIN / CREATININE RATIO, URINE
Creatinine, Urine: 159 mg/dL
Protein Creatinine Ratio: 1.35 mg/mg{Cre} — ABNORMAL HIGH (ref 0.00–0.15)
Total Protein, Urine: 215 mg/dL

## 2018-10-25 NOTE — MAU Note (Signed)
Urine sent to lab 

## 2018-10-25 NOTE — MAU Note (Signed)
PRE E instructions given to pt, return to [facility] for headache, blurred vision, decrease in fetal movement or contractions, pt verbalized understanding

## 2018-10-25 NOTE — MAU Note (Signed)
Was told to come back if she developed symptoms of Pre-E-states she started having a headache when she left that hasn't gone away.  Hasn't taken anything for the headache.  No visual disturbances.  Also reports mid epigastric pain that comes and goes.  No VB/LOF.  + FM.  Also took her BP and it was 165/113.

## 2018-10-25 NOTE — MAU Note (Signed)
Pt has had ctx since last night. Not timing them. Pt also feeling like she wanted to pass out, but hasn't checked her blood sugar today. GDM and CHTN

## 2018-10-25 NOTE — Telephone Encounter (Signed)
Pt husbands called concerned about his wife, she had been seen in [facility] for pain and her blood pressure was elevated and that they discharged her home and wanted to talk to the doctor that scheduled her c-section. He was concerned also why they aren't moving her cesarean section date up due to her being in pain. Explained to husband that they would not have sent her home if she was unstable, and if she does continue to hurt or contractions increase, have any vaginal bleeding, leaking of fluid, headache, or epigastric pain  to go back to [facility] for further evaluation. Explained things can change at anytime and to return to [facility] if he feels she needs to be reevaluated.   Scheryl Marten, RN

## 2018-10-25 NOTE — MAU Provider Note (Signed)
History    CSN: [id number] Arrival date and time: 10/25/18 1205 First Provider Initiated Contact with Patient 10/25/18 1251     Chief Complaint  Patient presents with  . Hypertension   HPI 22yo G1P0 at [redacted]w[redacted]d who presents with contractions. Pregnancy has been complicated by gestational diabetes with polyhydramnios and LGA, chronic hypertension, Treacher Collins, morbid obesity. States contractions have been worsening overnight. They are every 30 minutes. Also feeling pressure in vaginal area. Denies headaches, vision changes, abdominal pain. States swelling comes/goes in feet with position. Denies nausea/vomiting. Denies vaginal bleeding, leakage of fluids, increased vaginal discharge.   OB History    Gravida  1   Para      Term      Preterm      AB      Living  0     SAB      TAB      Ectopic      Multiple      Live Births              Past Medical History:  Diagnosis Date  . Migraines   . Treacher Collins syndrome     Past Surgical History:  Procedure Laterality Date  . TYMPANOSTOMY      Family History  Problem Relation Age of Onset  . Treacher Collins syndrome Sister   . ADD / ADHD Sister   . Diabetes Mother   . Hypertension Mother   . Hypertension Father   . Hypertension Paternal Uncle   . Hypertension Maternal Grandfather   . Hypertension Paternal Grandmother   . Hyperlipidemia Paternal Grandmother   . Hypertension Paternal Grandfather   . Hyperlipidemia Paternal Grandfather   . Cancer Maternal Uncle     Social History   Tobacco Use  . Smoking status: Current Some Day Smoker    Last attempt to quit: 03/11/2018    Years since quitting: 0.6  . Smokeless tobacco: Never Used  Substance Use Topics  . Alcohol use: No  . Drug use: Never    Allergies: No Known Allergies  No medications prior to admission.    Review of Systems  Constitutional: Positive for fatigue. Negative for activity change and appetite change.  Eyes: Negative for  visual disturbance.  Respiratory: Negative for shortness of breath.   Cardiovascular: Positive for leg swelling. Negative for chest pain and palpitations.  Genitourinary: Negative for dysuria, vaginal bleeding and vaginal discharge.  Musculoskeletal: Positive for back pain.  Skin: Negative for rash.  Neurological: Negative for dizziness, light-headedness and headaches.  Psychiatric/Behavioral: Negative for sleep disturbance. The patient is not nervous/anxious.    Physical Exam   Blood pressure (!) 154/99, pulse (!) 119, temperature 98.1 F (36.7 C), resp. rate 20, height 4\' 11"  (1.499 m), weight (!) 146.9 kg, last menstrual period 01/13/2018.  Physical Exam  Nursing note and vitals reviewed. Constitutional: She is oriented to person, place, and time. She appears well-developed and well-nourished. No distress.  HENT:  Head: Normocephalic and atraumatic.  Eyes: Conjunctivae and EOM are normal. No scleral icterus.  Cardiovascular: Normal rate.  Respiratory: Effort normal. No respiratory distress.  GI: Soft. There is abdominal tenderness.  gravid  Genitourinary:    Genitourinary Comments: SVE: closed/thick/posterior   Musculoskeletal:        General: Edema (trace) present.  Neurological: She is alert and oriented to person, place, and time.  Skin: Skin is warm and dry. No rash noted.  Psychiatric: She has a normal mood and affect. Her behavior  is normal.   MAU Course  Procedures  MDM -- reactive NST: 140s/mod/+a/-d; no contractions  -- elevated BPs in 140-150s, has been normal range in clinic, not on medication, will check UPC, CMP, CBC  -- CMP wnl, CBC wnl though platelets borderline at 154 -- consulted with Dr. Alysia Penna regarding new diagnosis of superimposed preeclampsia with UPC 1.35 - agreed with plan for repeat BP check in 2 days and possibly repeat labs   Assessment and Plan  22yo G1P0 at [redacted]w[redacted]d who presents with contractions. Not in active labor, reactive and reassuring NST.  Does have new diagnosis of superimposed preeclampsia based on UPC 1.35 and moderate range BP. Messaged clinic and encouraged patient to make appointment for BP check in 2 days. Has primary C/S scheduled in 1 week at 37-weeks for LGA. Counseling regarding signs and symptoms of preeclampsia. Patient voiced understanding. All questions answered and patient in agreement with plan.   Tamera Stands, DO  10/25/2018, 8:36 PM

## 2018-10-26 ENCOUNTER — Inpatient Hospital Stay (HOSPITAL_COMMUNITY): Payer: Medicaid Other | Admitting: Anesthesiology

## 2018-10-26 ENCOUNTER — Encounter (HOSPITAL_COMMUNITY): Payer: Self-pay

## 2018-10-26 ENCOUNTER — Ambulatory Visit (HOSPITAL_COMMUNITY): Admission: RE | Admit: 2018-10-26 | Payer: Medicaid Other | Source: Ambulatory Visit

## 2018-10-26 ENCOUNTER — Encounter (HOSPITAL_COMMUNITY)
Admission: AD | Disposition: A | Discharge status: Home / Self Care | Payer: Self-pay | Source: Home / Self Care | Attending: Family Medicine

## 2018-10-26 DIAGNOSIS — O1413 Severe pre-eclampsia, third trimester: Secondary | ICD-10-CM | POA: Diagnosis present

## 2018-10-26 DIAGNOSIS — O9902 Anemia complicating childbirth: Secondary | ICD-10-CM | POA: Diagnosis not present

## 2018-10-26 DIAGNOSIS — O403XX0 Polyhydramnios, third trimester, not applicable or unspecified: Secondary | ICD-10-CM | POA: Diagnosis present

## 2018-10-26 DIAGNOSIS — O99334 Smoking (tobacco) complicating childbirth: Secondary | ICD-10-CM | POA: Diagnosis not present

## 2018-10-26 DIAGNOSIS — O358XX0 Maternal care for other (suspected) fetal abnormality and damage, not applicable or unspecified: Secondary | ICD-10-CM | POA: Diagnosis not present

## 2018-10-26 DIAGNOSIS — O24420 Gestational diabetes mellitus in childbirth, diet controlled: Secondary | ICD-10-CM | POA: Diagnosis not present

## 2018-10-26 DIAGNOSIS — O3663X0 Maternal care for excessive fetal growth, third trimester, not applicable or unspecified: Secondary | ICD-10-CM | POA: Diagnosis not present

## 2018-10-26 DIAGNOSIS — Z3A36 36 weeks gestation of pregnancy: Secondary | ICD-10-CM

## 2018-10-26 DIAGNOSIS — O1414 Severe pre-eclampsia complicating childbirth: Secondary | ICD-10-CM | POA: Diagnosis not present

## 2018-10-26 DIAGNOSIS — Q754 Mandibulofacial dysostosis: Secondary | ICD-10-CM | POA: Diagnosis not present

## 2018-10-26 DIAGNOSIS — D649 Anemia, unspecified: Secondary | ICD-10-CM | POA: Diagnosis not present

## 2018-10-26 DIAGNOSIS — F17200 Nicotine dependence, unspecified, uncomplicated: Secondary | ICD-10-CM | POA: Diagnosis not present

## 2018-10-26 DIAGNOSIS — O114 Pre-existing hypertension with pre-eclampsia, complicating childbirth: Secondary | ICD-10-CM | POA: Diagnosis not present

## 2018-10-26 DIAGNOSIS — O1002 Pre-existing essential hypertension complicating childbirth: Secondary | ICD-10-CM | POA: Diagnosis not present

## 2018-10-26 DIAGNOSIS — O9912 Other diseases of the blood and blood-forming organs and certain disorders involving the immune mechanism complicating childbirth: Secondary | ICD-10-CM | POA: Diagnosis not present

## 2018-10-26 DIAGNOSIS — D696 Thrombocytopenia, unspecified: Secondary | ICD-10-CM | POA: Diagnosis present

## 2018-10-26 DIAGNOSIS — O99214 Obesity complicating childbirth: Secondary | ICD-10-CM | POA: Diagnosis not present

## 2018-10-26 LAB — TYPE AND SCREEN
ABO/RH(D): A POS
Antibody Screen: NEGATIVE

## 2018-10-26 LAB — CBC
HCT: 33.7 % — ABNORMAL LOW (ref 36.0–46.0)
Hemoglobin: 10.5 g/dL — ABNORMAL LOW (ref 12.0–15.0)
MCH: 26.8 pg (ref 26.0–34.0)
MCHC: 31.2 g/dL (ref 30.0–36.0)
MCV: 86 fL (ref 80.0–100.0)
Platelets: 118 10*3/uL — ABNORMAL LOW (ref 150–400)
RBC: 3.92 MIL/uL (ref 3.87–5.11)
RDW: 16 % — ABNORMAL HIGH (ref 11.5–15.5)
WBC: 9.9 10*3/uL (ref 4.0–10.5)
nRBC: 0 % (ref 0.0–0.2)

## 2018-10-26 LAB — COMPREHENSIVE METABOLIC PANEL
ALT: 12 U/L (ref 0–44)
AST: 19 U/L (ref 15–41)
Albumin: 2 g/dL — ABNORMAL LOW (ref 3.5–5.0)
Alkaline Phosphatase: 74 U/L (ref 38–126)
Anion gap: 10 (ref 5–15)
BUN: 8 mg/dL (ref 6–20)
CO2: 21 mmol/L — ABNORMAL LOW (ref 22–32)
Calcium: 8.6 mg/dL — ABNORMAL LOW (ref 8.9–10.3)
Chloride: 107 mmol/L (ref 98–111)
Creatinine, Ser: 0.56 mg/dL (ref 0.44–1.00)
GFR calc Af Amer: >60 mL/min (ref >60)
GFR calc non Af Amer: >60 mL/min (ref >60)
Glucose, Bld: 112 mg/dL — ABNORMAL HIGH (ref 70–99)
Potassium: 4 mmol/L (ref 3.5–5.1)
Sodium: 138 mmol/L (ref 135–145)
Total Bilirubin: 0.4 mg/dL (ref 0.3–1.2)
Total Protein: 5.6 g/dL — ABNORMAL LOW (ref 6.5–8.1)

## 2018-10-26 LAB — GLUCOSE, CAPILLARY
GLUCOSE-CAPILLARY: 108 mg/dL — AB (ref 70–99)
Glucose-Capillary: 136 mg/dL — ABNORMAL HIGH (ref 70–99)
Glucose-Capillary: 138 mg/dL — ABNORMAL HIGH (ref 70–99)
Glucose-Capillary: 121 mg/dL — ABNORMAL HIGH (ref 70–99)
Glucose-Capillary: 119 mg/dL — ABNORMAL HIGH (ref 70–99)
Glucose-Capillary: 107 mg/dL — ABNORMAL HIGH (ref 70–99)
Glucose-Capillary: 96 mg/dL (ref 70–99)
Glucose-Capillary: 115 mg/dL — ABNORMAL HIGH (ref 70–99)

## 2018-10-26 LAB — ABO/RH: ABO/RH(D): A POS

## 2018-10-26 SURGERY — Surgical Case
Anesthesia: Spinal | Site: Abdomen | Wound class: Clean Contaminated

## 2018-10-26 MED ORDER — FENTANYL CITRATE (PF) 100 MCG/2ML IJ SOLN
INTRAMUSCULAR | Status: DC | PRN
  Administered 2018-10-26: 85 ug via INTRAVENOUS
  Administered 2018-10-26: 15 ug via INTRATHECAL

## 2018-10-26 MED ORDER — ACETAMINOPHEN 325 MG PO TABS: 650.0000 mg | ORAL_TABLET | ORAL | Status: DC | PRN

## 2018-10-26 MED ORDER — ONDANSETRON HCL 4 MG/2ML IJ SOLN
INTRAMUSCULAR | Status: DC | PRN
  Administered 2018-10-26: 4 mg via INTRAVENOUS

## 2018-10-26 MED ORDER — LACTATED RINGERS IV SOLN
INTRAVENOUS | Status: DC
  Administered 2018-10-26 – 2018-10-27 (×2): via INTRAVENOUS

## 2018-10-26 MED ORDER — COCONUT OIL OIL
1.0000 "application " | TOPICAL_OIL | Status: DC | PRN
  Administered 2018-10-28: 1 via TOPICAL
  Filled 2018-10-26 (×2): qty 120

## 2018-10-26 MED ORDER — METOCLOPRAMIDE HCL 5 MG/ML IJ SOLN
INTRAMUSCULAR | Status: DC | PRN
  Administered 2018-10-26: 10 mg via INTRAVENOUS

## 2018-10-26 MED ORDER — LABETALOL HCL 5 MG/ML IV SOLN: 80.0000 mg | INTRAVENOUS | Status: DC | PRN

## 2018-10-26 MED ORDER — DEXTROSE-NACL 5-0.45 % IV SOLN
INTRAVENOUS | Status: DC
  Administered 2018-10-26: 04:00:00 via INTRAVENOUS

## 2018-10-26 MED ORDER — SIMETHICONE 80 MG PO CHEW
80.0000 mg | CHEWABLE_TABLET | ORAL | Status: DC
  Administered 2018-10-27 – 2018-10-28 (×3): 80 mg via ORAL
  Filled 2018-10-26 (×3): qty 1

## 2018-10-26 MED ORDER — LABETALOL HCL 5 MG/ML IV SOLN: 40.0000 mg | INTRAVENOUS | Status: DC | PRN

## 2018-10-26 MED ORDER — ZOLPIDEM TARTRATE 5 MG PO TABS: 5.0000 mg | ORAL_TABLET | Freq: Every evening | ORAL | Status: DC | PRN

## 2018-10-26 MED ORDER — SIMETHICONE 80 MG PO CHEW: 80.0000 mg | CHEWABLE_TABLET | ORAL | Status: DC | PRN

## 2018-10-26 MED ORDER — DEXTROSE 50 % IV SOLN: 25.0000 mL | INTRAVENOUS | Status: DC | PRN

## 2018-10-26 MED ORDER — FENTANYL CITRATE (PF) 100 MCG/2ML IJ SOLN
INTRAMUSCULAR | Status: AC
  Filled 2018-10-26: qty 2

## 2018-10-26 MED ORDER — NALBUPHINE HCL 10 MG/ML IJ SOLN: 5.0000 mg | INTRAMUSCULAR | Status: DC | PRN

## 2018-10-26 MED ORDER — DIPHENHYDRAMINE HCL 25 MG PO CAPS
25.0000 mg | ORAL_CAPSULE | ORAL | Status: DC | PRN
  Filled 2018-10-26: qty 1

## 2018-10-26 MED ORDER — INSULIN ASPART 100 UNIT/ML ~~LOC~~ SOLN: 0.0000 [IU] | Freq: Three times a day (TID) | SUBCUTANEOUS | Status: DC

## 2018-10-26 MED ORDER — DIPHENHYDRAMINE HCL 50 MG/ML IJ SOLN: 12.5000 mg | INTRAMUSCULAR | Status: DC | PRN

## 2018-10-26 MED ORDER — MAGNESIUM SULFATE 40 G IN LACTATED RINGERS - SIMPLE
2.0000 g/h | INTRAVENOUS | Status: DC
  Filled 2018-10-26: qty 500

## 2018-10-26 MED ORDER — MEPERIDINE HCL 25 MG/ML IJ SOLN: 6.2500 mg | INTRAMUSCULAR | Status: DC | PRN

## 2018-10-26 MED ORDER — ENOXAPARIN SODIUM 80 MG/0.8ML ~~LOC~~ SOLN
80.0000 mg | SUBCUTANEOUS | Status: DC
  Administered 2018-10-28 – 2018-10-29 (×2): 80 mg via SUBCUTANEOUS
  Filled 2018-10-26 (×3): qty 0.8

## 2018-10-26 MED ORDER — DOCUSATE SODIUM 100 MG PO CAPS: 100.0000 mg | ORAL_CAPSULE | Freq: Every day | ORAL | Status: DC

## 2018-10-26 MED ORDER — OXYTOCIN 40 UNITS IN NORMAL SALINE INFUSION - SIMPLE MED: 2.5000 [IU]/h | INTRAVENOUS | Status: AC

## 2018-10-26 MED ORDER — MORPHINE SULFATE (PF) 0.5 MG/ML IJ SOLN
INTRAMUSCULAR | Status: DC | PRN
  Administered 2018-10-26: .15 mg via INTRATHECAL

## 2018-10-26 MED ORDER — LABETALOL HCL 5 MG/ML IV SOLN: 20.0000 mg | INTRAVENOUS | Status: DC | PRN

## 2018-10-26 MED ORDER — METOCLOPRAMIDE HCL 5 MG/ML IJ SOLN
INTRAMUSCULAR | Status: AC
  Filled 2018-10-26: qty 2

## 2018-10-26 MED ORDER — BUPIVACAINE IN DEXTROSE 0.75-8.25 % IT SOLN
INTRATHECAL | Status: DC | PRN
  Administered 2018-10-26: 1.2 mL via INTRATHECAL

## 2018-10-26 MED ORDER — INSULIN REGULAR BOLUS VIA INFUSION
0.0000 [IU] | Freq: Three times a day (TID) | INTRAVENOUS | Status: DC
  Filled 2018-10-26: qty 10

## 2018-10-26 MED ORDER — OXYCODONE HCL 5 MG PO TABS
5.0000 mg | ORAL_TABLET | ORAL | Status: DC | PRN
  Administered 2018-10-29: 10 mg via ORAL
  Filled 2018-10-26: qty 2

## 2018-10-26 MED ORDER — PRENATAL MULTIVITAMIN CH: 1.0000 | ORAL_TABLET | Freq: Every day | ORAL | Status: DC

## 2018-10-26 MED ORDER — PHENYLEPHRINE 8 MG IN D5W 100 ML (0.08MG/ML) PREMIX OPTIME
INJECTION | INTRAVENOUS | Status: AC
  Filled 2018-10-26: qty 100

## 2018-10-26 MED ORDER — TETANUS-DIPHTH-ACELL PERTUSSIS 5-2.5-18.5 LF-MCG/0.5 IM SUSP
0.5000 mL | Freq: Once | INTRAMUSCULAR | Status: DC
  Filled 2018-10-26: qty 0.5

## 2018-10-26 MED ORDER — WITCH HAZEL-GLYCERIN EX PADS: 1.0000 "application " | MEDICATED_PAD | CUTANEOUS | Status: DC | PRN

## 2018-10-26 MED ORDER — ACETAMINOPHEN 325 MG PO TABS
650.0000 mg | ORAL_TABLET | Freq: Four times a day (QID) | ORAL | Status: DC | PRN
  Administered 2018-10-26 – 2018-10-28 (×3): 650 mg via ORAL
  Filled 2018-10-26 (×3): qty 2

## 2018-10-26 MED ORDER — MENTHOL 3 MG MT LOZG
1.0000 | LOZENGE | OROMUCOSAL | Status: DC | PRN
  Filled 2018-10-26: qty 9

## 2018-10-26 MED ORDER — SCOPOLAMINE 1 MG/3DAYS TD PT72
1.0000 | MEDICATED_PATCH | Freq: Once | TRANSDERMAL | Status: AC
  Administered 2018-10-26: 1.5 mg via TRANSDERMAL

## 2018-10-26 MED ORDER — ACETAMINOPHEN 500 MG PO TABS: 1000.0000 mg | ORAL_TABLET | Freq: Once | ORAL | Status: DC

## 2018-10-26 MED ORDER — NALOXONE HCL 4 MG/10ML IJ SOLN
1.0000 ug/kg/h | INTRAVENOUS | Status: DC | PRN
  Filled 2018-10-26: qty 5

## 2018-10-26 MED ORDER — SCOPOLAMINE 1 MG/3DAYS TD PT72
MEDICATED_PATCH | TRANSDERMAL | Status: AC
  Filled 2018-10-26: qty 1

## 2018-10-26 MED ORDER — ONDANSETRON HCL 4 MG/2ML IJ SOLN: 4.0000 mg | Freq: Once | INTRAMUSCULAR | Status: DC | PRN

## 2018-10-26 MED ORDER — HYDRALAZINE HCL 20 MG/ML IJ SOLN: 10.0000 mg | INTRAMUSCULAR | Status: DC | PRN

## 2018-10-26 MED ORDER — NALBUPHINE HCL 10 MG/ML IJ SOLN: 5.0000 mg | Freq: Once | INTRAMUSCULAR | Status: DC | PRN

## 2018-10-26 MED ORDER — CELECOXIB 400 MG PO CAPS
400.0000 mg | ORAL_CAPSULE | Freq: Once | ORAL | Status: DC
  Filled 2018-10-26: qty 1

## 2018-10-26 MED ORDER — FENTANYL CITRATE (PF) 100 MCG/2ML IJ SOLN
50.0000 ug | Freq: Once | INTRAMUSCULAR | Status: AC
  Administered 2018-10-26: 50 ug via INTRAVENOUS

## 2018-10-26 MED ORDER — MAGNESIUM SULFATE 40 G IN LACTATED RINGERS - SIMPLE
2.0000 g/h | INTRAVENOUS | Status: DC
  Administered 2018-10-26: 2 g/h via INTRAVENOUS
  Filled 2018-10-26 (×2): qty 500

## 2018-10-26 MED ORDER — PRENATAL MULTIVITAMIN CH
1.0000 | ORAL_TABLET | Freq: Every day | ORAL | Status: DC
  Administered 2018-10-26 – 2018-10-29 (×4): 1 via ORAL
  Filled 2018-10-26 (×4): qty 1

## 2018-10-26 MED ORDER — SODIUM CHLORIDE 0.9 % IV SOLN
INTRAVENOUS | Status: DC
  Administered 2018-10-26: 03:00:00 via INTRAVENOUS

## 2018-10-26 MED ORDER — ENOXAPARIN SODIUM 80 MG/0.8ML ~~LOC~~ SOLN: 80.0000 mg | SUBCUTANEOUS | Status: DC

## 2018-10-26 MED ORDER — OXYTOCIN 10 UNIT/ML IJ SOLN
INTRAMUSCULAR | Status: AC
  Filled 2018-10-26: qty 4

## 2018-10-26 MED ORDER — ONDANSETRON HCL 4 MG/2ML IJ SOLN
INTRAMUSCULAR | Status: AC
  Filled 2018-10-26: qty 2

## 2018-10-26 MED ORDER — SIMETHICONE 80 MG PO CHEW
80.0000 mg | CHEWABLE_TABLET | Freq: Three times a day (TID) | ORAL | Status: DC
  Administered 2018-10-27 – 2018-10-29 (×5): 80 mg via ORAL
  Filled 2018-10-26 (×5): qty 1

## 2018-10-26 MED ORDER — MORPHINE SULFATE (PF) 0.5 MG/ML IJ SOLN
INTRAMUSCULAR | Status: AC
  Filled 2018-10-26: qty 10

## 2018-10-26 MED ORDER — LACTATED RINGERS IV SOLN
INTRAVENOUS | Status: DC | PRN
  Administered 2018-10-26 (×2): via INTRAVENOUS

## 2018-10-26 MED ORDER — IBUPROFEN 600 MG PO TABS
600.0000 mg | ORAL_TABLET | Freq: Four times a day (QID) | ORAL | Status: DC | PRN
  Administered 2018-10-29: 600 mg via ORAL
  Filled 2018-10-26: qty 1

## 2018-10-26 MED ORDER — MAGNESIUM SULFATE BOLUS VIA INFUSION
4.0000 g | Freq: Once | INTRAVENOUS | Status: AC
  Administered 2018-10-26: 4 g via INTRAVENOUS
  Filled 2018-10-26: qty 500

## 2018-10-26 MED ORDER — SENNOSIDES-DOCUSATE SODIUM 8.6-50 MG PO TABS
2.0000 | ORAL_TABLET | ORAL | Status: DC
  Administered 2018-10-27 – 2018-10-28 (×3): 2 via ORAL
  Filled 2018-10-26 (×3): qty 2

## 2018-10-26 MED ORDER — STERILE WATER FOR IRRIGATION IR SOLN
Status: DC | PRN
  Administered 2018-10-26: 1000 mL

## 2018-10-26 MED ORDER — KETOROLAC TROMETHAMINE 30 MG/ML IJ SOLN
30.0000 mg | Freq: Four times a day (QID) | INTRAMUSCULAR | Status: AC
  Administered 2018-10-27 (×3): 30 mg via INTRAVENOUS
  Filled 2018-10-26 (×3): qty 1

## 2018-10-26 MED ORDER — ONDANSETRON HCL 4 MG/2ML IJ SOLN: 4.0000 mg | Freq: Three times a day (TID) | INTRAMUSCULAR | Status: DC | PRN

## 2018-10-26 MED ORDER — ACETAMINOPHEN 10 MG/ML IV SOLN
1000.0000 mg | Freq: Once | INTRAVENOUS | Status: DC | PRN
  Administered 2018-10-26: 1000 mg via INTRAVENOUS

## 2018-10-26 MED ORDER — NALOXONE HCL 0.4 MG/ML IJ SOLN: 0.4000 mg | INTRAMUSCULAR | Status: DC | PRN

## 2018-10-26 MED ORDER — TRANEXAMIC ACID-NACL 1000-0.7 MG/100ML-% IV SOLN
INTRAVENOUS | Status: AC
  Filled 2018-10-26: qty 100

## 2018-10-26 MED ORDER — SODIUM CHLORIDE 0.9 % IR SOLN
Status: DC | PRN
  Administered 2018-10-26: 1000 mL

## 2018-10-26 MED ORDER — DEXTROSE 5 % IV SOLN
3.0000 g | INTRAVENOUS | Status: AC
  Administered 2018-10-26: 3 g via INTRAVENOUS
  Filled 2018-10-26: qty 3000

## 2018-10-26 MED ORDER — PHENYLEPHRINE 8 MG IN D5W 100 ML (0.08MG/ML) PREMIX OPTIME
INJECTION | INTRAVENOUS | Status: DC | PRN
  Administered 2018-10-26: 30 ug/min via INTRAVENOUS

## 2018-10-26 MED ORDER — GABAPENTIN 400 MG PO CAPS
400.0000 mg | ORAL_CAPSULE | Freq: Once | ORAL | Status: DC
  Filled 2018-10-26: qty 1

## 2018-10-26 MED ORDER — IBUPROFEN 800 MG PO TABS
800.0000 mg | ORAL_TABLET | Freq: Three times a day (TID) | ORAL | Status: DC
  Administered 2018-10-26: 800 mg via ORAL
  Filled 2018-10-26: qty 1

## 2018-10-26 MED ORDER — DIBUCAINE 1 % RE OINT
1.0000 "application " | TOPICAL_OINTMENT | RECTAL | Status: DC | PRN
  Filled 2018-10-26: qty 28

## 2018-10-26 MED ORDER — BETAMETHASONE SOD PHOS & ACET 6 (3-3) MG/ML IJ SUSP
12.0000 mg | INTRAMUSCULAR | Status: DC
  Filled 2018-10-26 (×2): qty 2

## 2018-10-26 MED ORDER — OXYTOCIN 40 UNITS IN NORMAL SALINE INFUSION - SIMPLE MED
INTRAVENOUS | Status: DC | PRN
  Administered 2018-10-26: 40 mL via INTRAVENOUS

## 2018-10-26 MED ORDER — INSULIN REGULAR(HUMAN) IN NACL 100-0.9 UT/100ML-% IV SOLN
INTRAVENOUS | Status: DC
  Administered 2018-10-26: 1 [IU]/h via INTRAVENOUS
  Administered 2018-10-26: 0.8 [IU]/h via INTRAVENOUS
  Administered 2018-10-26: 0.7 [IU]/h via INTRAVENOUS
  Filled 2018-10-26: qty 100

## 2018-10-26 MED ORDER — SODIUM CHLORIDE 0.9% FLUSH: 3.0000 mL | INTRAVENOUS | Status: DC | PRN

## 2018-10-26 MED ORDER — METHYLERGONOVINE MALEATE 0.2 MG/ML IJ SOLN
INTRAMUSCULAR | Status: DC | PRN
  Administered 2018-10-26: 0.2 mg via INTRAMUSCULAR

## 2018-10-26 MED ORDER — SODIUM CHLORIDE 0.9 % IV SOLN
INTRAVENOUS | Status: DC
  Administered 2018-10-26 (×2): via EPIDURAL
  Filled 2018-10-26 (×6): qty 20

## 2018-10-26 MED ORDER — LACTATED RINGERS IV SOLN
INTRAVENOUS | Status: DC | PRN
  Administered 2018-10-26: 11:00:00 via INTRAVENOUS

## 2018-10-26 MED ORDER — METHYLERGONOVINE MALEATE 0.2 MG/ML IJ SOLN
INTRAMUSCULAR | Status: AC
  Filled 2018-10-26: qty 1

## 2018-10-26 MED ORDER — DIPHENHYDRAMINE HCL 25 MG PO CAPS: 25.0000 mg | ORAL_CAPSULE | Freq: Four times a day (QID) | ORAL | Status: DC | PRN

## 2018-10-26 MED ORDER — SOD CITRATE-CITRIC ACID 500-334 MG/5ML PO SOLN
ORAL | Status: AC
  Administered 2018-10-26: 30 mL
  Filled 2018-10-26: qty 15

## 2018-10-26 MED ORDER — DEXTROSE 5 % IV SOLN
INTRAVENOUS | Status: AC
  Filled 2018-10-26: qty 3000

## 2018-10-26 MED ORDER — GABAPENTIN 800 MG PO TABS
400.0000 mg | ORAL_TABLET | Freq: Once | ORAL | Status: DC
  Filled 2018-10-26: qty 0.5

## 2018-10-26 MED ORDER — CALCIUM CARBONATE ANTACID 500 MG PO CHEW: 2.0000 | CHEWABLE_TABLET | ORAL | Status: DC | PRN

## 2018-10-26 MED ORDER — ACETAMINOPHEN 10 MG/ML IV SOLN
INTRAVENOUS | Status: AC
  Filled 2018-10-26: qty 100

## 2018-10-26 MED ORDER — TRANEXAMIC ACID-NACL 1000-0.7 MG/100ML-% IV SOLN
1000.0000 mg | INTRAVENOUS | Status: AC
  Administered 2018-10-26: 1000 mg via INTRAVENOUS
  Filled 2018-10-26: qty 100

## 2018-10-26 SURGICAL SUPPLY — 35 items
BENZOIN TINCTURE PRP APPL 2/3 (GAUZE/BANDAGES/DRESSINGS) ×3 IMPLANT
CHLORAPREP W/TINT 26ML (MISCELLANEOUS) ×5 IMPLANT
CLAMP CORD UMBIL (MISCELLANEOUS) ×2 IMPLANT
CLOSURE WOUND 1/2 X4 (GAUZE/BANDAGES/DRESSINGS) ×1
CLOTH BEACON ORANGE TIMEOUT ST (SAFETY) ×3 IMPLANT
DRSG OPSITE POSTOP 4X10 (GAUZE/BANDAGES/DRESSINGS) ×3 IMPLANT
ELECT REM PT RETURN 9FT ADLT (ELECTROSURGICAL) ×3
ELECTRODE REM PT RTRN 9FT ADLT (ELECTROSURGICAL) ×1 IMPLANT
EXTENDER TRAXI PANNICULUS (MISCELLANEOUS) IMPLANT
GLOVE BIOGEL PI IND STRL 7.0 (GLOVE) ×2 IMPLANT
GLOVE BIOGEL PI IND STRL 7.5 (GLOVE) ×2 IMPLANT
GLOVE BIOGEL PI INDICATOR 7.0 (GLOVE) ×4
GLOVE BIOGEL PI INDICATOR 7.5 (GLOVE) ×4
GLOVE ECLIPSE 7.5 STRL STRAW (GLOVE) ×3 IMPLANT
GOWN STRL REUS W/TWL LRG LVL3 (GOWN DISPOSABLE) ×9 IMPLANT
HOVERMATT SINGLE USE (MISCELLANEOUS) ×2 IMPLANT
KIT PREVENA INCISION MGT20CM45 (CANNISTER) ×2 IMPLANT
NS IRRIG 1000ML POUR BTL (IV SOLUTION) ×3 IMPLANT
PACK C SECTION WH (CUSTOM PROCEDURE TRAY) ×3 IMPLANT
PAD OB MATERNITY 4.3X12.25 (PERSONAL CARE ITEMS) ×3 IMPLANT
PENCIL SMOKE EVAC W/HOLSTER (ELECTROSURGICAL) ×3 IMPLANT
RETRACTOR TRAXI PANNICULUS (MISCELLANEOUS) IMPLANT
RTRCTR C-SECT PINK 25CM LRG (MISCELLANEOUS) ×3 IMPLANT
SPONGE LAP 18X18 RF (DISPOSABLE) ×9 IMPLANT
STRIP CLOSURE SKIN 1/2X4 (GAUZE/BANDAGES/DRESSINGS) ×2 IMPLANT
SUT PLAIN 2 0 XLH (SUTURE) ×2 IMPLANT
SUT VIC AB 0 CTX 36 (SUTURE) ×8
SUT VIC AB 0 CTX36XBRD ANBCTRL (SUTURE) ×3 IMPLANT
SUT VIC AB 2-0 CT1 27 (SUTURE) ×2
SUT VIC AB 2-0 CT1 TAPERPNT 27 (SUTURE) ×1 IMPLANT
SUT VIC AB 4-0 KS 27 (SUTURE) ×3 IMPLANT
TOWEL OR 17X24 6PK STRL BLUE (TOWEL DISPOSABLE) ×3 IMPLANT
TRAXI PANNICULUS EXTENDER (MISCELLANEOUS) ×2
TRAXI PANNICULUS RETRACTOR (MISCELLANEOUS) ×2
TRAY FOLEY W/BAG SLVR 14FR LF (SET/KITS/TRAYS/PACK) ×3 IMPLANT

## 2018-10-26 NOTE — Progress Notes (Signed)
Patient ID: Suzanne Frederick, female   DOB: [birth year], 23 y.o.   MRN: [id number]  The risks of cesarean section discussed with the patient included but were not limited to: bleeding which may require transfusion or reoperation; infection which may require antibiotics; injury to bowel, bladder, ureters or other surrounding organs; injury to the fetus; need for additional procedures including hysterectomy in the event of a life-threatening hemorrhage; placental abnormalities wth subsequent pregnancies, incisional problems, thromboembolic phenomenon and other postoperative/anesthesia complications. The patient concurred with the proposed plan, giving informed written consent for the procedure.   Patient has been NPO since last night, she will remain NPO for procedure. Anesthesia and OR aware.  Preoperative prophylactic Ancef ordered on call to the OR.  To OR when ready.  Levie Heritage, DO 10/26/2018 9:29 AM

## 2018-10-26 NOTE — Progress Notes (Signed)
I/O fluids from 1600-1900 75cc/hr for maintenance fluid, MgSO4 25cc/hr - ColieWhitlockRNC

## 2018-10-26 NOTE — Progress Notes (Signed)
Patient ID: Suzanne Frederick, female   DOB: 05-22-1996, 23 y.o.   MRN: [id number]  Patient currently comfortable. I discussed plan of care with anesthesia - will discontinue epidural at this time and transfer patient to high risk OB floor. 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 She did get duramorph. Will give toradol for pain.  Levie Heritage, DO 10/26/2018 7:58 PM

## 2018-10-26 NOTE — H&P (Signed)
Suzanne Frederick is a 23 y.o. female G1P0 IUP 36 2/7 weeks with [facility] and recently Dx SIPEC. Presents to MAU with elevated BP and HA now. SIPEC d/t to elevated UPCR. Reports good fetal movement, denies vaginal bleeding or LOF. Pt also with GDM with suboptima control and macrosomia. 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 Pt has Treacher Collins Syndrome.   OB History    Gravida  1   Para      Term      Preterm      AB      Living  0     SAB      TAB      Ectopic      Multiple      Live Births             Past Medical History:  Diagnosis Date  . Migraines   . Treacher Collins syndrome    Past Surgical History:  Procedure Laterality Date  . TYMPANOSTOMY     Family History: family history includes ADD / ADHD in her sister; Cancer in her maternal uncle; Diabetes in her mother; Hyperlipidemia in her paternal grandfather and paternal grandmother; Hypertension in her father, maternal grandfather, mother, paternal grandfather, paternal grandmother, and paternal uncle; Designer, fashion/clothing syndrome in her sister. Social History:  reports that she has been smoking. She has never used smokeless tobacco. She reports that she does not drink alcohol or use drugs.     Maternal Diabetes: Yes:  Diabetes Type:  Diet controlled Genetic Screening: Normal Maternal Ultrasounds/Referrals: Abnormal:  Findings:   Other: Fetal Ultrasounds or other Referrals:  None, Referred to Materal Fetal Medicine  Maternal Substance Abuse:  No Significant Maternal Medications:  None Significant Maternal Lab Results:  Lab values include: Other:  Other Comments:  None  Review of Systems  Constitutional: Negative.   Eyes: Negative for blurred vision.  Respiratory: Negative.   Cardiovascular: Positive for leg swelling.  Gastrointestinal: Negative for abdominal pain, nausea and vomiting.  Genitourinary: Negative.   Neurological: Positive for headaches.   Maternal  Medical History:  Reason for admission: Nausea.      Blood pressure 119/67, pulse 93, temperature 98.1 F (36.7 C), resp. rate 19, height 4\' 11"  (1.499 m), weight (!) 147.6 kg, last menstrual period 01/13/2018, SpO2 97 %. Exam Physical Exam  Constitutional: She appears well-developed and well-nourished.  Cardiovascular: Normal rate, regular rhythm and normal heart sounds.  Respiratory: Effort normal and breath sounds normal.  GI: Soft. Bowel sounds are normal.  Genitourinary:    Genitourinary Comments: deferred     Prenatal labs: ABO, Rh: A/Positive/-- (07/30 1120) Antibody: Negative (07/30 1120) Rubella: 1.55 (07/30 1120) RPR: Non Reactive (11/21 0855)  HBsAg: Negative (07/30 1120)  HIV: Non Reactive (11/21 0855)  GBS:     Assessment/Plan: IUP 36 2/7 weeks CHTN with SIPEC now with severe features GDM suboptimal control Macrosomia Infant with cleft lip/palate Maternal Treacher Collins syndrome  Pt will be admitted, begin Magnesium therapy, Glucose stabilizer fro glucose control, BMZ for FLM. Pt eat a full meal 10:30-11:00 PM. RNST presently. Do not feel immediate delivery a is indicated. Above POC with continuous fetal monitoring, repeat labs and plan for primary c section at 9:30 AM. NICU consult as well. POC reviewed with pt, husband and family, verbalized understanding.     Hermina Staggers 10/26/2018, 1:31 AM

## 2018-10-26 NOTE — Anesthesia Postprocedure Evaluation (Signed)
Anesthesia Post Note  Patient: Suzanne Frederick  Procedure(s) Performed: CESAREAN SECTION (N/A Abdomen)     Patient location during evaluation: PACU Anesthesia Type: Spinal and Epidural Level of consciousness: awake Pain management: pain level controlled Vital Signs Assessment: post-procedure vital signs reviewed and stable Respiratory status: spontaneous breathing Cardiovascular status: stable Postop Assessment: no headache, no backache, spinal receding and no apparent nausea or vomiting Anesthetic complications: no    Last Vitals:  Vitals:   10/26/18 1315 10/26/18 1324  BP: 139/87   Pulse: (!) 102 (!) 105  Resp: 10 19  Temp: 36.6 C   SpO2: (!) 89% 93%    Last Pain:  Vitals:   10/26/18 1324  TempSrc:   PainSc: 2    Pain Goal:    LLE Motor Response: Responds to commands, Purposeful movement (10/26/18 1315)   RLE Motor Response: Responds to commands, Purposeful movement (10/26/18 1315)       @ANFLOW60MIN (12500)  )Caren Macadam

## 2018-10-26 NOTE — Anesthesia Postprocedure Evaluation (Signed)
Anesthesia Post Note  Patient: SHALIN FRIDDLE  Procedure(s) Performed: CESAREAN SECTION (N/A Abdomen)     Patient location during evaluation: L&D Anesthesia Type: Epidural and Spinal Level of consciousness: awake, awake and alert and oriented Pain management: pain level controlled Vital Signs Assessment: post-procedure vital signs reviewed and stable Respiratory status: spontaneous breathing, nonlabored ventilation and respiratory function stable Cardiovascular status: stable Postop Assessment: no headache, no backache, no apparent nausea or vomiting, patient able to bend at knees and adequate PO intake (epidural infusion post op for pain control) Anesthetic complications: no    Last Vitals:  Vitals:   10/26/18 1525 10/26/18 1600  BP:  132/73  Pulse:  98  Resp: 18 18  Temp:    SpO2: 99% 97%    Last Pain:  Vitals:   10/26/18 1600  TempSrc:   PainSc: 0-No pain   Pain Goal:    LLE Motor Response: Purposeful movement (10/26/18 1600) LLE Sensation: Full sensation (10/26/18 1600) RLE Motor Response: Purposeful movement (10/26/18 1600) RLE Sensation: Full sensation (10/26/18 1600)     @ANFLOW60MIN (12500)  )Clarisse Rodriges

## 2018-10-26 NOTE — Discharge Summary (Signed)
Postpartum Discharge Summary   Patient Name: Suzanne Frederick DOB: May 13, 1996 MRN: [id number]  Date of admission: 10/25/2018 Delivering Provider: Levie Heritage   Date of discharge: 10/29/2018  Admitting diagnosis: 36WKS HEADACHE, UPPER STOMACH PAIN, CTX Intrauterine pregnancy: [redacted]w[redacted]d     Secondary diagnosis:  Active Problems:   Treacher Collins syndrome   Obesity complicating peripregnancy, antepartum   GDM (gestational diabetes mellitus)   LGA (large for gestational age) fetus affecting management of mother   Polyhydramnios affecting pregnancy   Severe preeclampsia, third trimester      Discharge diagnosis: Preterm Pregnancy Delivered, Preeclampsia (severe) and GDM A2                                                                                                Post partum procedures:none  Augmentation: n/a  Complications: None  Hospital course:  Sceduled C/S   23 y.o. yo G1P0101 at [redacted]w[redacted]d was admitted to the hospital 10/25/2018 for preeclampsia with severe features by headaches, blood pressures and thrombocytopenia. She was started on magnesium and given betamethasone for fetal lung maturity. She was made NPO at midnight and had scheduled cesarean section on admission day 1 in the morning. Scheduled cesarean section was already planned given known fetal macrosomia.  Membrane Rupture Time/Date: 10:51 AM ,10/26/2018   Patient delivered a Viable infant.10/26/2018   Details of operation can be found in separate operative note, no complications. She was continued on magnesium for 24-hours postpartum for seizure prophylaxis  Patient had an uncomplicated postpartum course.  She is ambulating, tolerating a regular diet, passing flatus, and urinating well. Patient is discharged home in stable condition on 10/29/2018           Magnesium Sulfate recieved: Yes BMZ received: Yes  Physical exam  Vitals:   10/28/18 1620 10/28/18 1941 10/29/18 0609 10/29/18 0808  BP: (!) 144/72 (!) 153/98 (!)  144/91 (!) 147/91  Pulse: (!) 103 (!) 121 92 97  Resp: 18 18 18 18   Temp: 98.6 F (37 C) 99.1 F (37.3 C) 98.5 F (36.9 C) 97.9 F (36.6 C)  TempSrc: Oral Oral Oral Oral  SpO2: 99% 99% 99% 97%  Weight:      Height:       General: alert, cooperative and no distress Lochia: appropriate Uterine Fundus: firm Incision: Healing well with no significant drainage, Prevena in place DVT Evaluation: No evidence of DVT seen on physical exam. Labs: Lab Results  Component Value Date   WBC 8.0 10/27/2018   HGB 8.6 (L) 10/27/2018   HCT 27.0 (L) 10/27/2018   MCV 87.1 10/27/2018   PLT 121 (L) 10/27/2018   CMP Latest Ref Rng & Units 10/26/2018  Glucose 70 - 99 mg/dL 220(U)  BUN 6 - 20 mg/dL 8  Creatinine 5.42 - 7.06 mg/dL 2.37  Sodium 628 - 315 mmol/L 138  Potassium 3.5 - 5.1 mmol/L 4.0  Chloride 98 - 111 mmol/L 107  CO2 22 - 32 mmol/L 21(L)  Calcium 8.9 - 10.3 mg/dL 1.7(O)  Total Protein 6.5 - 8.1 g/dL 1.6(W)  Total Bilirubin 0.3 - 1.2 mg/dL 0.4  Alkaline Phos  38 - 126 U/L 74  AST 15 - 41 U/L 19  ALT 0 - 44 U/L 12    Discharge instruction: per After Visit Summary and "Baby and Me Booklet".  After visit meds:  Allergies as of 10/29/2018   No Known Allergies     Medication List    TAKE these medications   amLODipine 10 MG tablet Commonly known as:  NORVASC Take 1 tablet (10 mg total) by mouth daily. Start taking on:  October 30, 2018   nitrofurantoin (macrocrystal-monohydrate) 100 MG capsule Commonly known as:  MACROBID Take 1 capsule (100 mg total) by mouth 2 (two) times daily.   oxyCODONE 5 MG immediate release tablet Commonly known as:  Oxy IR/ROXICODONE Take 1 tablet (5 mg total) by mouth every 6 (six) hours as needed for moderate pain.   Prenatal Vitamin 27-0.8 MG Tabs Take 1 tablet by mouth daily.   propranolol 40 MG tablet Commonly known as:  INDERAL Take 40 mg by mouth daily.       Diet: carb modified diet  Activity: Advance as tolerated. Pelvic rest  for 6 weeks.   Outpatient follow up:1 week Follow up Appt: Future Appointments  Date Time Provider Department Center  11/01/2018  8:15 AM [facility] NURSE [facility]  11/01/2018  8:30 AM [facility] LAB [facility]  11/16/2018 10:30 AM Allie Bossierove, Myra C, MD [facility]   Follow up Visit:   Please schedule this patient for Postpartum visit in: 1 week with the following provider: Any provider For C/S patients schedule nurse incision check in weeks 2 weeks: yes High risk pregnancy complicated by: HTN Delivery mode:  CS Anticipated Birth Control:  Depo PP Procedures needed: BP check  Schedule Integrated BH visit: no      Newborn Data: Live born female  Birth Weight: 10 lb 0.5 oz (4550 g) APGAR: 7, 8  Newborn Delivery   Birth date/time:  10/26/2018 10:53:00 Delivery type:  C-Section, Low Transverse Trial of labor:  No C-section categorization:  Primary     Baby Feeding: Breast Disposition:NICU   10/29/2018 Scheryl DarterJames Daveion Robar, MD

## 2018-10-26 NOTE — Progress Notes (Signed)
Set mom up on a double electric pump and also gave mom a manual hand pump to express colostrum to give to infant. Assisted mom with manual pump and educated mom on double electric pump.

## 2018-10-26 NOTE — Transfer of Care (Signed)
Immediate Anesthesia Transfer of Care Note  Patient: Suzanne Frederick  Procedure(s) Performed: CESAREAN SECTION (N/A Abdomen)  Patient Location: PACU  Anesthesia Type:Spinal  Level of Consciousness: awake, alert  and oriented  Airway & Oxygen Therapy: Patient Spontanous Breathing and Patient connected to face mask oxygen  Post-op Assessment: Report given to RN and Post -op Vital signs reviewed and stable  Post vital signs: Reviewed and stable  Last Vitals:  Vitals Value Taken Time  BP 125/80 10/26/2018 12:15 PM  Temp    Pulse 104 10/26/2018 12:18 PM  Resp 14 10/26/2018 12:18 PM  SpO2 88 % 10/26/2018 12:18 PM  Vitals shown include unvalidated device data.  Last Pain:  Vitals:   10/26/18 0922  TempSrc: Oral  PainSc:          Complications: No apparent anesthesia complications

## 2018-10-26 NOTE — Addendum Note (Signed)
Addendum  created 10/26/18 1554 by Leilani Able, MD   Intraprocedure LDAs edited, LDA properties accepted

## 2018-10-26 NOTE — Anesthesia Preprocedure Evaluation (Addendum)
Anesthesia Evaluation  Patient identified by MRN, date of birth, ID band Patient awake    Reviewed: NPO status , Patient's Chart, lab work & pertinent test results, reviewed documented beta blocker date and time   Airway Mallampati: III       Dental no notable dental hx. (+) Poor Dentition   Pulmonary Current Smoker,    Pulmonary exam normal breath sounds clear to auscultation       Cardiovascular hypertension, Pt. on home beta blockers  Rhythm:Regular Rate:Tachycardia     Neuro/Psych    GI/Hepatic   Endo/Other  diabetes, GestationalMorbid obesity  Renal/GU      Musculoskeletal   Abdominal (+) + obese,   Peds  Hematology  (+) Blood dyscrasia, anemia ,   Anesthesia Other Findings   Reproductive/Obstetrics (+) Pregnancy                            Anesthesia Physical Anesthesia Plan  ASA: III  Anesthesia Plan: Combined Spinal and Epidural   Post-op Pain Management:    Induction:   PONV Risk Score and Plan: 2 and Ondansetron, Dexamethasone and Scopolamine patch - Pre-op  Airway Management Planned: Nasal Cannula  Additional Equipment:   Intra-op Plan:   Post-operative Plan:   Informed Consent: I have reviewed the patients History and Physical, chart, labs and discussed the procedure including the risks, benefits and alternatives for the proposed anesthesia with the patient or authorized representative who has indicated his/her understanding and acceptance.       Plan Discussed with: CRNA  Anesthesia Plan Comments:        Anesthesia Quick Evaluation

## 2018-10-26 NOTE — Progress Notes (Signed)
Inpatient Diabetes Program Recommendations  AACE/ADA: New Consensus Statement on Inpatient Glycemic Control (2015)  Target Ranges:  Prepandial:   less than 140 mg/dL      Peak postprandial:   less than 180 mg/dL (1-2 hours)      Critically ill patients:  140 - 180 mg/dL   Lab Results  Component Value Date   GLUCAP 96 10/26/2018   HGBA1C 6.6 (H) 10/19/2018    Review of Glycemic Control Results for Suzanne Frederick, Suzanne Frederick (MRN [id number]) as of 10/26/2018 14:01  Ref. Range 10/26/2018 05:35 10/26/2018 06:36 10/26/2018 07:41 10/26/2018 08:46  Glucose-Capillary Latest Ref Range: 70 - 99 mg/dL 702 (H) 637 (H) 858 (H) 96   Diabetes history: GDM Outpatient Diabetes medications: none  Current orders for Inpatient glycemic control: none  Inpatient Diabetes Program Recommendations:    Noted IV insulin this AM related to GDM. Consider adding Novolog 0-9 units TID & QHS.   Thanks, Suzanne Rave, MSN, RNC-OB Diabetes Coordinator [phone] (8a-5p)

## 2018-10-26 NOTE — Progress Notes (Signed)
Duplicate 1525 entry for Fentanyl 65mcg/ml bupivacaine 0.0625% in sodium chloride 0.9% epidural - ColieWhitlockRNC

## 2018-10-26 NOTE — Op Note (Signed)
Suzanne Frederick PROCEDURE DATE: 10/26/2018  PREOPERATIVE DIAGNOSIS: Intrauterine pregnancy at  5748w2d weeks gestation; superimposed preeclampsia with severe features; fetal macrosomia  POSTOPERATIVE DIAGNOSIS: The same  PROCEDURE: Primary Low Transverse Cesarean Section  SURGEON:  Dr. Candelaria CelesteJacob Stinson  ASSISTANT: Dr. Rhett BannisterLaurel Kwanza Cancelliere   INDICATIONS: Suzanne Frederick is a 23 y.o. G1P0101 at 3348w2d scheduled for cesarean section secondary to elective for fetal macrosomia and superimposed preeclampsia with severe features.  The risks of cesarean section discussed with the patient included but were not limited to: bleeding which may require transfusion or reoperation; infection which may require antibiotics; injury to bowel, bladder, ureters or other surrounding organs; injury to the fetus; need for additional procedures including hysterectomy in the event of a life-threatening hemorrhage; placental abnormalities wth subsequent pregnancies, incisional problems, thromboembolic phenomenon and other postoperative/anesthesia complications. The patient concurred with the proposed plan, giving informed written consent for the procedure.    FINDINGS:  Viable female infant in vertex presentation.  Apgars 7 and 8, weight, 10 pounds and 0.5 ounces.  Clear amniotic fluid.  Intact placenta, three vessel cord.  Normal uterus, fallopian tubes and ovaries bilaterally.  ANESTHESIA:    Spinal INTRAVENOUS FLUIDS: 2500 ml ESTIMATED BLOOD LOSS: 629 ml URINE OUTPUT:  200 ml SPECIMENS: Placenta sent to pathology COMPLICATIONS: None immediate  PROCEDURE IN DETAIL:  The patient received intravenous antibiotics and had sequential compression devices applied to her lower extremities while in the preoperative area.  She was then taken to the operating room where spinal anesthesia was administered (epidural anesthesia was dosed up to surgical level) and was found to be adequate. She was then placed in a dorsal supine position with  a leftward tilt, and prepped and draped in a sterile manner.  A foley catheter was placed into her bladder and attached to constant gravity, which drained clear fluid throughout.  After an adequate timeout was performed, a Joel-Cohen skin incision was made with scalpel and carried through to the underlying layer of fascia. The fascia was incised in the midline and this incision was extended bilaterally using the Mayo scissors. Kocher clamps were applied to the superior aspect of the fascial incision and the underlying rectus muscles were dissected off bluntly. A similar process was carried out on the inferior aspect of the facial incision. The rectus muscles were separated in the midline bluntly and the peritoneum was entered bluntly. An Alexis retractor was placed to aid in visualization of the uterus.  Attention was turned to the lower uterine segment where a transverse hysterotomy was made with a scalpel and extended bilaterally bluntly. The infant was successfully delivered, and cord was clamped and cut and infant was handed over to awaiting neonatology team. Uterine massage was then administered and the placenta delivered intact with three-vessel cord. The uterus was exteriorized then cleared of clot and debris.  The hysterotomy was closed with 0 Vicryl in a running locked fashion, and an imbricating layer was also placed with a 0 Vicryl. Overall, excellent hemostasis was noted. The uterus was returned to the abdomen. The abdomen and the pelvis were cleared of all clot and debris and the Jon Gillslexis was removed. Hemostasis was confirmed on all surfaces.  The peritoneum was reapproximated using 2-0 vicryl running stitches. The fascia was then closed using 0 Vicryl in a running fashion. The subcutaneous layer was reapproximated with plain gut and the skin was closed with 4-0 vicryl. The patient tolerated the procedure well. Sponge, lap, instrument and needle counts were correct x 2. She  was taken to the recovery room  in stable condition.   Rhett Bannister S, DO 10/26/2018 1:20 PM

## 2018-10-26 NOTE — Anesthesia Procedure Notes (Signed)
Spinal  Patient location during procedure: OR Start time: 10/26/2018 10:05 AM End time: 10/26/2018 10:15 AM Staffing Anesthesiologist: Leilani Able, MD Preanesthetic Checklist Completed: patient identified, site marked, surgical consent, pre-op evaluation, timeout performed, IV checked, risks and benefits discussed and monitors and equipment checked Spinal Block Patient position: sitting Prep: DuraPrep Patient monitoring: cardiac monitor, continuous pulse ox, blood pressure and heart rate Approach: midline Location: L3-4 Injection technique: catheter Needle Needle type: Tuohy and Sprotte  Needle gauge: 24 G Needle length: 12.7 cm Needle insertion depth: 9 cm Catheter type: closed end flexible Catheter size: 19 g Assessment Sensory level: T6

## 2018-10-27 LAB — CBC
HCT: 27 % — ABNORMAL LOW (ref 36.0–46.0)
Hemoglobin: 8.6 g/dL — ABNORMAL LOW (ref 12.0–15.0)
MCH: 27.7 pg (ref 26.0–34.0)
MCHC: 31.9 g/dL (ref 30.0–36.0)
MCV: 87.1 fL (ref 80.0–100.0)
Platelets: 121 10*3/uL — ABNORMAL LOW (ref 150–400)
RBC: 3.1 MIL/uL — ABNORMAL LOW (ref 3.87–5.11)
RDW: 16.1 % — ABNORMAL HIGH (ref 11.5–15.5)
WBC: 8 10*3/uL (ref 4.0–10.5)
nRBC: 0 % (ref 0.0–0.2)

## 2018-10-27 LAB — GLUCOSE, CAPILLARY
Glucose-Capillary: 89 mg/dL (ref 70–99)
Glucose-Capillary: 133 mg/dL — ABNORMAL HIGH (ref 70–99)

## 2018-10-27 LAB — CULTURE, OB URINE: Culture: >=100000 — AB

## 2018-10-27 NOTE — Progress Notes (Signed)
Subjective: Postpartum Day 1: Cesarean Delivery Patient reports incisional pain and tolerating PO.    Objective: Vital signs in last 24 hours: Temp:  [97.9 F (36.6 C)-99.1 F (37.3 C)] 99.1 F (37.3 C) (01/15 1133) Pulse Rate:  [85-113] 113 (01/15 1133) Resp:  [10-20] 18 (01/15 1133) BP: (123-149)/(60-87) 126/77 (01/15 1133) SpO2:  [89 %-100 %] 96 % (01/15 1133)  Physical Exam:  General: alert, cooperative and no distress Lochia: appropriate Uterine Fundus: firm Incision: healing well DVT Evaluation: No evidence of DVT seen on physical exam.  Recent Labs    10/26/18 0542 10/27/18 0630  HGB 10.5* 8.6*  HCT 33.7* 27.0*    Assessment/Plan: Status post Cesarean section. Doing well postoperatively.  Continue current care.  [name] 10/27/2018, 12:57 PM

## 2018-10-27 NOTE — Lactation Note (Signed)
This note was copied from a baby's chart. 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 Noticed that the junctures in her pump were loose and adjusted them, let mom that she'll feel a difference in the suction level the next time she pumps. Mom already getting volume, she got about 10-12 ml of colostrum in her last pumping session. Reviewed milk storage guidelines for NICU infants.   Mom participated in the [facility] program at [facility], but she doesn't have a pump at home. Form will be faxed to request a [facility] appt for a Symphony pump through the [facility] office. Mom is already familiar with hand expression and obtaining colostrum; praised her for her efforts.   Feeding plan:  1. Encouraged mom to pump every 2-3 hours during the day and at least once at night; a minimum of 8 pumping sessions in 24 hours 2. Mom will turn her EBM to her RN ASAP, she's aware of breastmilk storage guidelines  BF brochure, BF resources and pumping log were reviewed. Mom reported all questions and concerns were answered, she's aware of LC services and will call PRN.    Maternal Data Formula Feeding for Exclusion: No Has patient been taught Hand Expression?: Yes Does the patient have breastfeeding experience prior to this delivery?: No  Feeding Feeding Type: Breast Milk  Interventions Interventions: Breast feeding basics reviewed;DEBP  Lactation Tools Discussed/Used Tools: Pump Breast pump type:  Double-Electric Breast Pump WIC Program: Yes Pump Review: Setup, frequency, and cleaning;Milk Storage Initiated by:: RN and MPeck (adjusted junctures in her pump, they're loose) Date initiated:: 10/26/18   Consult Status Consult Status: PRN Follow-up type: In-patient    Suzanne Frederick 10/27/2018, 4:58 PM

## 2018-10-28 MED ORDER — NITROFURANTOIN MONOHYD MACRO 100 MG PO CAPS
100.0000 mg | ORAL_CAPSULE | Freq: Two times a day (BID) | ORAL | Status: DC
  Administered 2018-10-28 – 2018-10-29 (×3): 100 mg via ORAL
  Filled 2018-10-28 (×5): qty 1

## 2018-10-28 MED ORDER — AMLODIPINE BESYLATE 10 MG PO TABS
10.0000 mg | ORAL_TABLET | Freq: Every day | ORAL | Status: DC
  Administered 2018-10-28 – 2018-10-29 (×2): 10 mg via ORAL
  Filled 2018-10-28 (×2): qty 1

## 2018-10-28 NOTE — Lactation Note (Signed)
This note was copied from a baby's chart. Lactation Consultation Note: Follow up visit with this mom of NICU baby born at 57 w 2 d. Mom reports she has pumped 4 times and obtained small amounts. Encouraged to pump 8 times/24 hours to promote good milk supply. Has WIC in [place]. I will fax referral to them for pump for home. No questions at present. To call for assist when ready to latch baby to the breast.   Patient Name: Suzanne Frederick XBWIO'M Date: 10/28/2018 Reason for consult: Follow-up assessment;Late-preterm 34-36.6wks Type of Endocrine Disorder?: Diabetes   Maternal Data Has patient been taught Hand Expression?: Yes Does the patient have breastfeeding experience prior to this delivery?: No  Feeding Feeding Type: Formula  LATCH Score                   Interventions    Lactation Tools Discussed/Used WIC Program: Yes   Consult Status Consult Status: Follow-up Date: 10/29/18 Follow-up type: In-patient    Suzanne Frederick 10/28/2018, 9:40 AM

## 2018-10-28 NOTE — Progress Notes (Signed)
Subjective: Postpartum Day 2: Cesarean Delivery Patient reports incisional pain, tolerating PO and no problems voiding.    Objective: Vital signs in last 24 hours: Temp:  [98.7 F (37.1 C)-99.8 F (37.7 C)] 99.3 F (37.4 C) (01/16 0937) Pulse Rate:  [96-118] 109 (01/16 0937) Resp:  [18-20] 20 (01/16 0937) BP: (144-167)/(82-101) 145/99 (01/16 0937) SpO2:  [96 %-99 %] 98 % (01/16 0937)  Physical Exam:  General: alert, cooperative and no distress Lochia: appropriate Uterine Fundus: firm Incision: no significant drainage DVT Evaluation: No evidence of DVT seen on physical exam.  Recent Labs    10/26/18 0542 10/27/18 0630  HGB 10.5* 8.6*  HCT 33.7* 27.0*    Assessment/Plan: Status post Cesarean section. Doing well postoperatively.  Continue current care.  Suzanne Frederick 10/28/2018, 1:24 PM

## 2018-10-29 MED ORDER — OXYCODONE HCL 5 MG PO TABS: 5.0000 mg | ORAL_TABLET | Freq: Four times a day (QID) | ORAL | 0 refills | Status: DC | PRN

## 2018-10-29 MED ORDER — AMLODIPINE BESYLATE 10 MG PO TABS: 10.0000 mg | ORAL_TABLET | Freq: Every day | ORAL | 1 refills | Status: DC

## 2018-10-29 MED ORDER — NITROFURANTOIN MONOHYD MACRO 100 MG PO CAPS: 100.0000 mg | ORAL_CAPSULE | Freq: Two times a day (BID) | ORAL | 0 refills | Status: DC

## 2018-10-29 NOTE — Lactation Note (Signed)
This note was copied from a baby's chart. Lactation Consultation Note: Follow up visit with mom of NICU baby. She reports that she pumped 7-8 times yesterday. Did obtain small amounts of Colostrum last night. Pleased about that. Encouragement given. Reviewed our phone number to call with questions or when ready to put baby to the breast. Has WIC in [address]. I sent referral to them yesterday. Encouraged to call them this morning about a pump for home. Mom states she has manual pump and can pump while here visiting baby. For probable DC today. No questions at present. Encouraged to pump 8 times/24 hours to promote good milk supply.   Patient Name: Girl Radene Limone KGURK'Y Date: 10/29/2018 Reason for consult: Follow-up assessment;Late-preterm 34-36.6wks;NICU baby;Other (Comment)(cleft lip/palate)   Maternal Data Has patient been taught Hand Expression?: Yes Does the patient have breastfeeding experience prior to this delivery?: No  Feeding Feeding Type: Formula  LATCH Score                   Interventions    Lactation Tools Discussed/Used WIC Program: Yes   Consult Status Consult Status: Complete    Pamelia Hoit 10/29/2018, 8:48 AM

## 2018-10-29 NOTE — Progress Notes (Signed)
Discharge instructions given, questions answered, pt states understanding. Given copy of d/c papers at this time.
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
## 2018-11-01 ENCOUNTER — Ambulatory Visit: Payer: Medicaid Other

## 2018-11-01 ENCOUNTER — Inpatient Hospital Stay (HOSPITAL_COMMUNITY): Admit: 2018-11-01 | Payer: Medicaid Other | Admitting: Obstetrics and Gynecology

## 2018-11-01 ENCOUNTER — Telehealth: Payer: Self-pay | Admitting: Radiology

## 2018-11-01 ENCOUNTER — Other Ambulatory Visit: Payer: Medicaid Other

## 2018-11-01 ENCOUNTER — Ambulatory Visit (HOSPITAL_COMMUNITY): Payer: Medicaid Other

## 2018-11-01 VITALS — BP 158/93 | HR 72

## 2018-11-01 DIAGNOSIS — Z5189 Encounter for other specified aftercare: Secondary | ICD-10-CM

## 2018-11-01 NOTE — Telephone Encounter (Signed)
Left message for patient explaining that it is important that we see her, she did not show up for her appointment this morning for BP check, incision check and PP GTT. Asked patient to call [facility] to schedule appointment to be seen asap.

## 2018-11-01 NOTE — Progress Notes (Signed)
Subjective:     Suzanne Frederick is a 23 y.o. female who presents to the clinic 6 days post-op weeks status post low-traverse cesection. Eating a regular diet without difficulty. Bowel movements are normal. Patient complains of moderate pain.   Review of Systems   Objective:    BP (!) 158/93   Pulse 72   LMP 01/13/2018  General:  Normal appearance   Abdomen: {soft bowel sounds active non-tender  Incision:   incision no dehiscence incision well approximated,healing well,no drainage,no erythema,no hernia,no seroma,no swelling     Assessment:    Doing well at this time. Patient blood pressure is elevated. She reports she has not started taking blood pressure medication or any other medications at this time. She is refusing to do her 2 hour gtt at this time. She reports she does not have for the gtt today.    Plan:    1. Continue any current medications. 2. Wound care discussed. Continue to keep wound clean and dry. Wound vac has been removed. Steri strips are still in place. 3. Activity restrictions:  4. Anticipated return to work: Patient is not employed at this time.  5. Follow up: at postpartum appointment or before if you have any concerns. Advised patient to start blood pressure medications ASAP.  Demetrice Emeline Darling, CMA

## 2018-11-03 NOTE — Progress Notes (Signed)
I have reviewed this chart and agree with the RN/CMA assessment and management.    Wrenly Lauritsen C Lorrin Bodner, MD, FACOG Attending Physician, Faculty Practice [facility]  

## 2018-11-08 ENCOUNTER — Ambulatory Visit (HOSPITAL_COMMUNITY): Payer: Medicaid Other

## 2018-11-16 ENCOUNTER — Ambulatory Visit: Payer: Medicaid Other | Admitting: Family Medicine

## 2018-11-16 ENCOUNTER — Ambulatory Visit: Payer: Medicaid Other | Admitting: Obstetrics & Gynecology

## 2018-12-16 NOTE — Progress Notes (Signed)
Obstetrics and Gynecology Postpartum Visit  Appointment Date: 12/20/2018  OBGYN Clinic: [facility]  Primary Care Provider: [facility]  Chief Complaint:  Chief Complaint  Patient presents with  . Postpartum Care    History of Present Illness: Suzanne Frederick is a 23 y.o. Caucasian G1P0101 (LMP: just ended), seen for the above chief complaint. Her past medical history is significant for BMI 50s, GDM (not checking CBGs during pregnancy), cHTN with superimposed pre-eclampsia, maternal treacher collins   She is s/p pLTCS @ 36wks on 10/26/2018; she was discharged to home on POD#3  Vaginal bleeding or discharge: No  Breast or formula feeding: formula Contraception after delivery: No  Pap smear: no abnormalities (date: 2019)  Review of Systems: as noted in the History of Present Illness.  Patient Active Problem List   Diagnosis Date Noted  . Severe preeclampsia, third trimester 10/26/2018  . LGA (large for gestational age) fetus affecting management of mother 10/19/2018  . Polyhydramnios affecting pregnancy 10/19/2018  . Non-compliant patient 10/19/2018  . GDM (gestational diabetes mellitus) 09/04/2018  . BMI 50.0-59.9, adult (HCC) 06/08/2018  . Treat as a patient with Chronic HTN 06/08/2018  . Abnormal glucose affecting pregnancy 06/08/2018  . Nausea and vomiting of pregnancy, antepartum 05/11/2018  . ADHD (attention deficit hyperactivity disorder) 02/22/2013  . Depression 02/22/2013  . Menorrhagia 02/22/2013  . Headache 02/22/2013  . 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 Current Outpatient Medications  Medication Sig Dispense Refill  . Prenatal Vit-Fe Fumarate-FA (PRENATAL VITAMIN) 27-0.8 MG TABS Take 1 tablet by mouth daily. 90 tablet 3   Norvasc 10mg  PO daily     . propranolol (INDERAL) 40 MG tablet Take 40 mg by mouth daily.      No current  facility-administered medications for this visit.     Allergies Patient has no known allergies.  Physical Exam:  BP 126/86   Pulse 72   Wt 252 lb (114.3 kg)   LMP 01/13/2018   BMI 50.90 kg/m  Body mass index is 50.9 kg/m. General appearance: Well nourished, well developed female in no acute distress.  Abdomen: obese, soft, nttp, nd. Well healed low transverse skin inicision Neuro/Psych:  Normal mood and affect.  Skin:  Warm and dry.   Laboratory: UPT negative  PP Depression Screening:  EPDS score 9  Assessment: pt doing well  Plan:  Routine care. After options d/w pt, depo provera today. Pt told to d/c the norvasc and will bring back in one week for bp check and 2h 75gm GTT   Pt told to call PCP for routine care visit in about 3 months  RTC 1wk bp and 2h PP GTT  Cornelia Copa MD Attending Center for [facility])

## 2018-12-20 ENCOUNTER — Encounter: Payer: Self-pay | Admitting: Obstetrics and Gynecology

## 2018-12-20 ENCOUNTER — Ambulatory Visit (INDEPENDENT_AMBULATORY_CARE_PROVIDER_SITE_OTHER): Payer: Medicaid Other | Admitting: Obstetrics and Gynecology

## 2018-12-20 DIAGNOSIS — Z3042 Encounter for surveillance of injectable contraceptive: Secondary | ICD-10-CM | POA: Diagnosis not present

## 2018-12-20 DIAGNOSIS — Z3202 Encounter for pregnancy test, result negative: Secondary | ICD-10-CM

## 2018-12-20 MED ORDER — MEDROXYPROGESTERONE ACETATE 150 MG/ML IM SUSP
150.0000 mg | Freq: Once | INTRAMUSCULAR | Status: AC
  Administered 2018-12-20: 150 mg via INTRAMUSCULAR

## 2018-12-20 MED ORDER — MEDROXYPROGESTERONE ACETATE 150 MG/ML IM SUSP: 150.0000 mg | INTRAMUSCULAR | 0 refills | Status: DC

## 2018-12-21 LAB — POCT URINE PREGNANCY: Preg Test, Ur: NEGATIVE

## 2018-12-21 NOTE — Addendum Note (Signed)
Addended by: Cheree Ditto, Liylah Najarro A on: 12/21/2018 08:32 AM   Modules accepted: Orders

## 2019-02-21 ENCOUNTER — Other Ambulatory Visit: Payer: Self-pay | Admitting: *Deleted

## 2019-02-21 MED ORDER — METRONIDAZOLE 500 MG PO TABS: 500.0000 mg | ORAL_TABLET | Freq: Two times a day (BID) | ORAL | 0 refills | Status: DC

## 2019-02-21 NOTE — Telephone Encounter (Signed)
Pt called requesting meds for BV. Rx sent into pharmacy

## 2019-03-08 ENCOUNTER — Ambulatory Visit: Payer: Medicaid Other

## 2019-03-09 ENCOUNTER — Ambulatory Visit: Payer: Medicaid Other

## 2019-03-15 ENCOUNTER — Ambulatory Visit (INDEPENDENT_AMBULATORY_CARE_PROVIDER_SITE_OTHER): Payer: Medicaid Other

## 2019-03-15 ENCOUNTER — Other Ambulatory Visit: Payer: Self-pay

## 2019-03-15 VITALS — BP 105/78 | HR 72

## 2019-03-15 DIAGNOSIS — Z3042 Encounter for surveillance of injectable contraceptive: Secondary | ICD-10-CM | POA: Diagnosis not present

## 2019-03-15 MED ORDER — MEDROXYPROGESTERONE ACETATE 150 MG/ML IM SUSP
150.0000 mg | Freq: Once | INTRAMUSCULAR | Status: AC
  Administered 2019-03-15: 150 mg via INTRAMUSCULAR

## 2019-03-15 NOTE — Progress Notes (Signed)
Patient presented to the office today for depo-provera injection.    Patient will follow up in three month for her next injection.    HCG- not indicated Last period- 02/2019 Given by D.Graham 150 mg IM in left arm

## 2019-05-10 ENCOUNTER — Other Ambulatory Visit: Payer: Self-pay | Admitting: *Deleted

## 2019-05-10 MED ORDER — MEDROXYPROGESTERONE ACETATE 150 MG/ML IM SUSP: 150.0000 mg | INTRAMUSCULAR | 3 refills | Status: AC

## 2019-05-31 ENCOUNTER — Other Ambulatory Visit: Payer: Self-pay

## 2019-05-31 ENCOUNTER — Ambulatory Visit (INDEPENDENT_AMBULATORY_CARE_PROVIDER_SITE_OTHER): Payer: Medicaid Other

## 2019-05-31 VITALS — BP 102/79

## 2019-05-31 DIAGNOSIS — Z3042 Encounter for surveillance of injectable contraceptive: Secondary | ICD-10-CM | POA: Diagnosis not present

## 2019-05-31 MED ORDER — MEDROXYPROGESTERONE ACETATE 150 MG/ML IM SUSP
150.0000 mg | Freq: Once | INTRAMUSCULAR | Status: AC
  Administered 2019-05-31: 150 mg via INTRAMUSCULAR

## 2019-05-31 NOTE — Progress Notes (Signed)
Patient presented to the office today for her depo-provera injection given in left arm IM 150 mg. Patient will follow up in three months.   [facility] # [id number] Last period approximate 04/2019 Given by: Bethene Hankinson Phillip Heal

## 2019-08-16 ENCOUNTER — Ambulatory Visit: Payer: Medicaid Other

## 2021-10-30 ENCOUNTER — Ambulatory Visit: Payer: Self-pay | Admitting: Podiatry

## 2021-11-27 ENCOUNTER — Other Ambulatory Visit: Payer: Self-pay | Admitting: Ophthalmology

## 2021-11-27 DIAGNOSIS — G932 Benign intracranial hypertension: Secondary | ICD-10-CM

## 2021-12-08 ENCOUNTER — Ambulatory Visit: Admission: RE | Admit: 2021-12-08 | Payer: Medicaid Other | Source: Ambulatory Visit

## 2021-12-08 ENCOUNTER — Ambulatory Visit: Payer: Medicaid Other

## 2021-12-09 ENCOUNTER — Other Ambulatory Visit: Payer: Medicaid Other

## 2021-12-18 ENCOUNTER — Other Ambulatory Visit: Payer: Medicaid Other

## 2021-12-18 ENCOUNTER — Other Ambulatory Visit: Payer: Self-pay

## 2021-12-18 ENCOUNTER — Ambulatory Visit
Admission: RE | Admit: 2021-12-18 | Discharge: 2021-12-18 | Disposition: A | Discharge status: Home / Self Care | Payer: Medicaid Other | Source: Ambulatory Visit | Attending: Ophthalmology | Admitting: Ophthalmology

## 2021-12-18 DIAGNOSIS — G932 Benign intracranial hypertension: Secondary | ICD-10-CM | POA: Diagnosis not present

## 2021-12-18 IMAGING — MR MR HEAD WO/W CM
13 series · 48 of 48 positions shown · IV contrast (gadavist)
Comparison: None.

CLINICAL DATA: Pseudotumor cerebri [Z0] ([Z0]-CM)

EXAM:
MRI HEAD WITHOUT AND WITH CONTRAST
TECHNIQUE: Multiplanar, multiecho pulse sequences of the brain and surrounding
structures were obtained without and with intravenous contrast.
CONTRAST:  10mL GADAVIST GADOBUTROL 1 MMOL/ML IV SOLN

[Series 5: ax dwi_tracew · axial · 3.0mm · 0.65mm/px · z∈[-105,+47]mm · 7 of 96 slices shown]
[im 1/96]
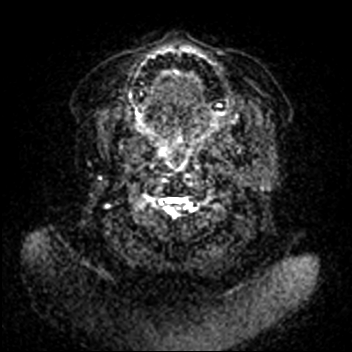
[im 16/96]
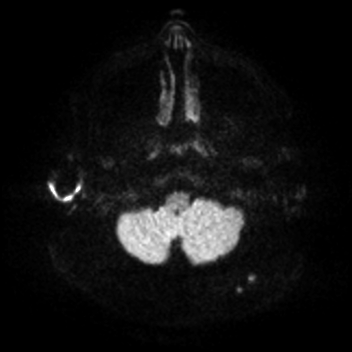
[im 32/96]
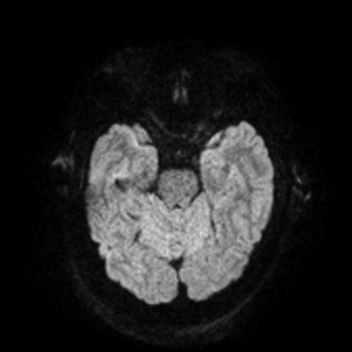
[im 48/96]
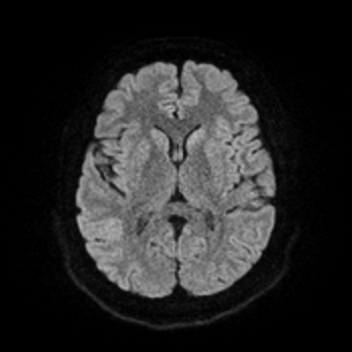
[im 64/96]
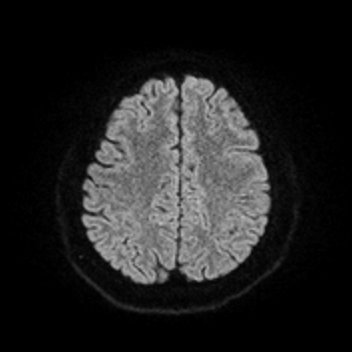
[im 80/96]
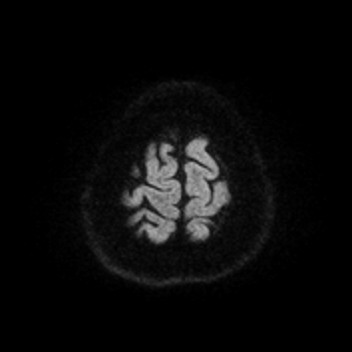
[im 96/96]
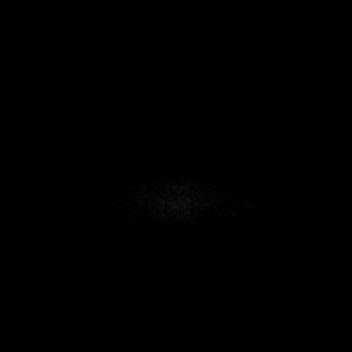

[Series 6: ax dwi_adc · axial · 3.0mm · 0.65mm/px · z∈[-105,+44]mm · 3 of 47 slices shown]
[im 1/47]
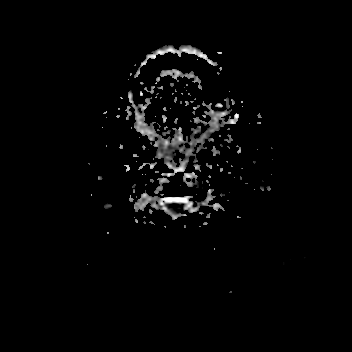
[im 24/47]
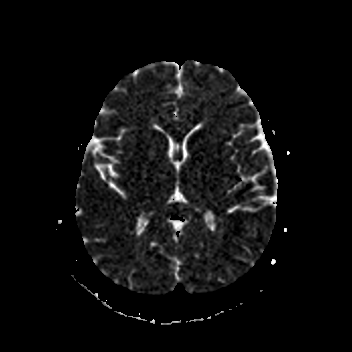
[im 47/47]
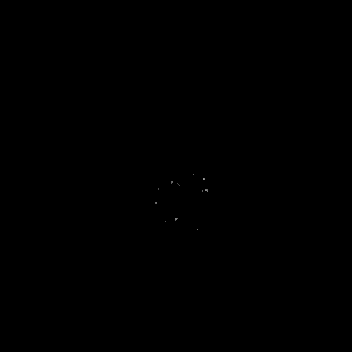

[Series 7: cor dwi_tracew · coronal · 5.0mm · 0.60mm/px · 2 of 38 slices shown]
[im 1/38]
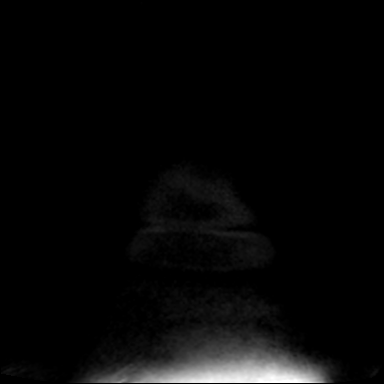
[im 38/38]
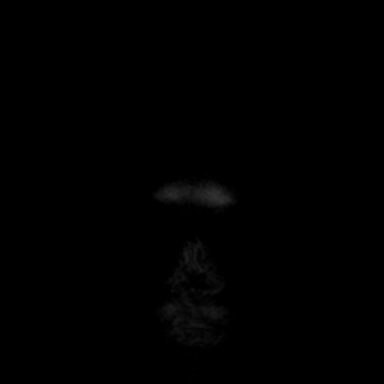

[Series 8: cor dwi_adc · coronal · 5.0mm · 0.60mm/px · 2 of 38 slices shown]
[im 1/38]
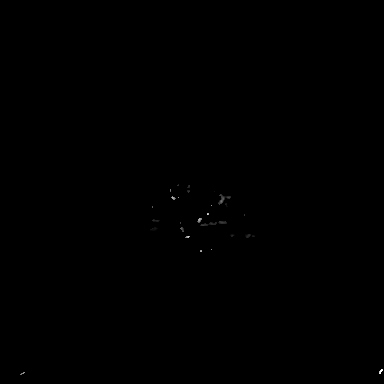
[im 38/38]
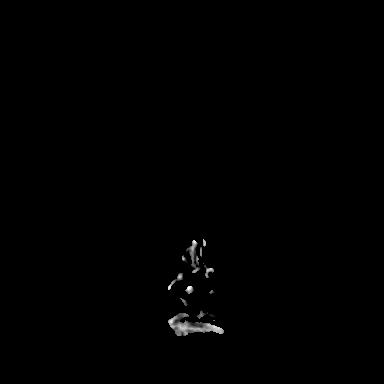

[Series 9: T1 · sagittal · 5.0mm · 0.62mm/px · 1 of 25 slices shown (1 of 2)]
[im 1/25]
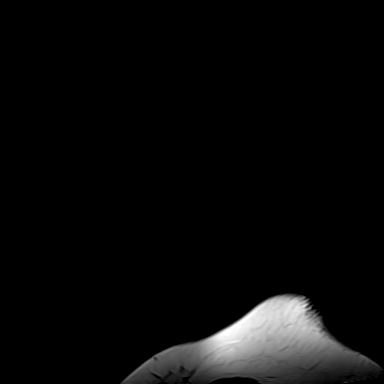

[Series 10: T2 · axial · 5.0mm · 0.53mm/px · 1 of 25 slices shown]
[im 1/25]
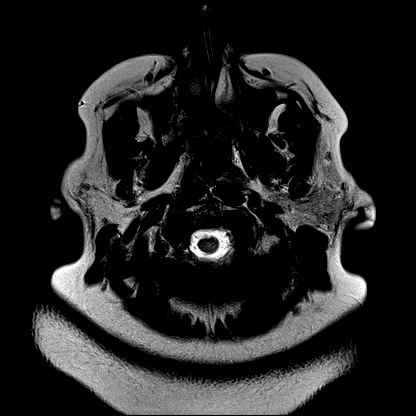

[Series 12: pha_images · axial · 3.0mm · 0.90mm/px · z∈[-111,+56]mm · 3 of 57 slices shown]
[im 1/57]
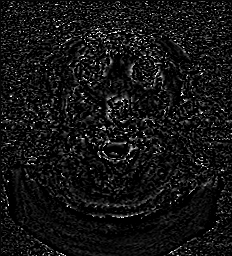
[im 29/57]
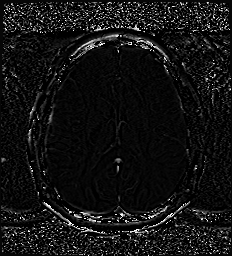
[im 57/57]
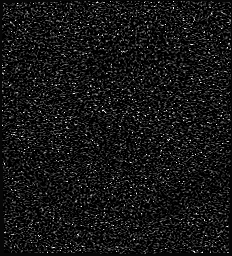

[Series 13: swi_images · axial · 3.0mm · 0.90mm/px · z∈[-114,+59]mm · 4 of 60 slices shown]
[im 1/60]
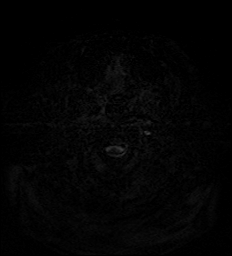
[im 20/60]
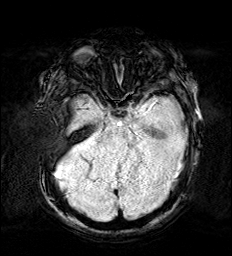
[im 40/60]
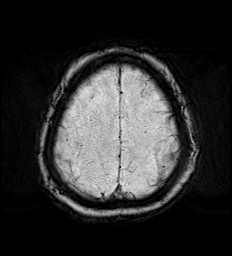
[im 60/60]
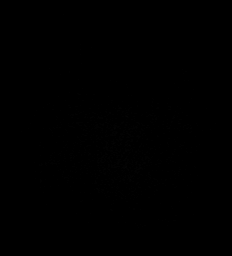

[Series 15: FLAIR · axial · 3.0mm · 0.53mm/px · z∈[-108,+50]mm · 3 of 55 slices shown]
[im 1/55]
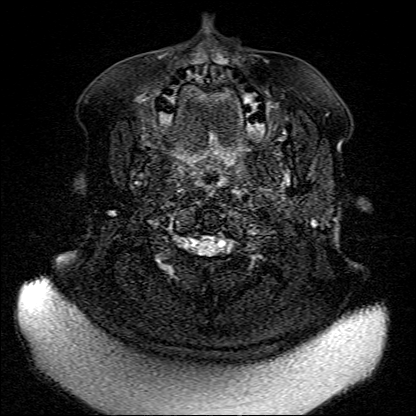
[im 28/55]
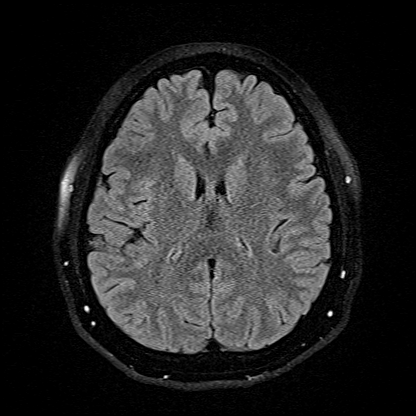
[im 55/55]
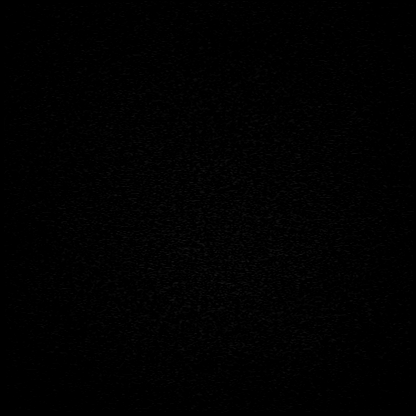

[Series 16: T1 · axial · 1.0mm · 0.98mm/px · z∈[-105,+51]mm · 9 of 160 slices shown (2 of 2)]
[im 1/160]
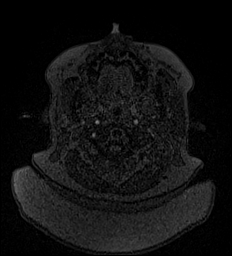
[im 20/160]
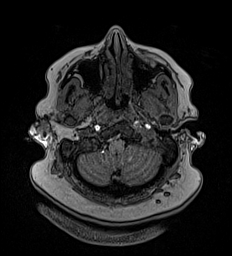
[im 40/160]
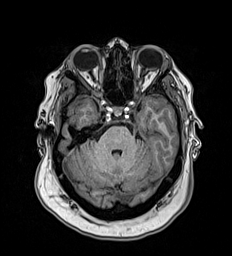
[im 60/160]
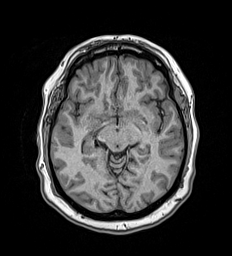
[im 80/160]
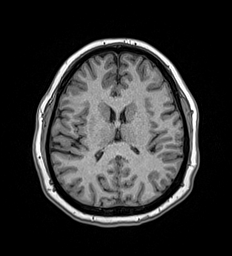
[im 100/160]
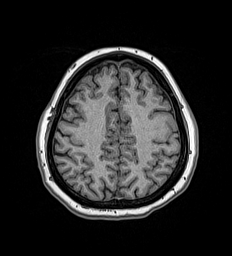
[im 120/160]
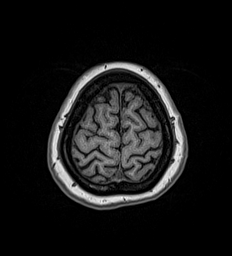
[im 140/160]
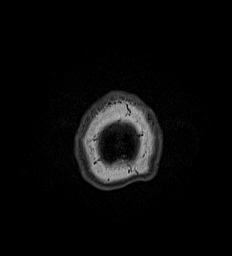
[im 160/160]
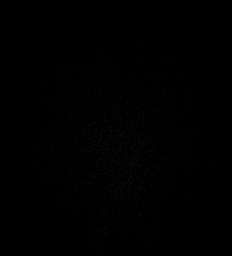

[Series 17: T2 post-contrast · coronal · 5.0mm · 0.57mm/px · 2 of 29 slices shown]
[im 1/29]
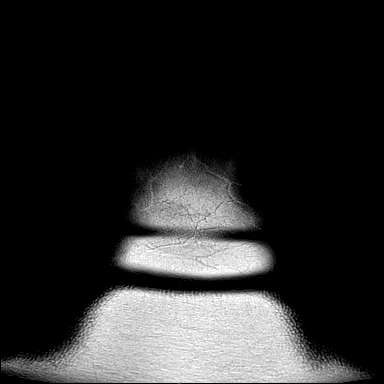
[im 29/29]
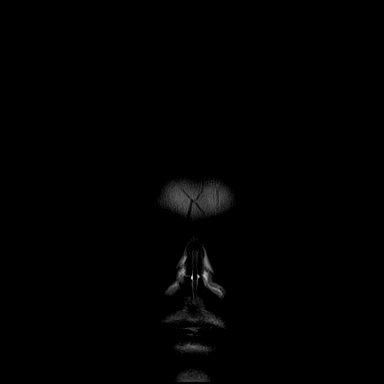

[Series 18: T1 post-contrast · axial · 1.0mm · 0.98mm/px · z∈[-105,+51]mm · 9 of 160 slices shown (1 of 2)]
[im 1/160]
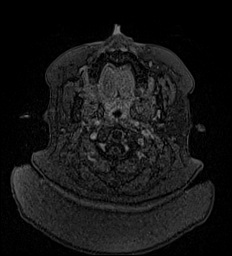
[im 20/160]
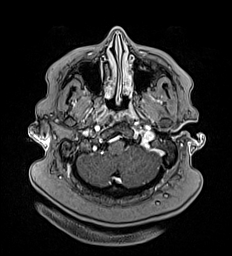
[im 40/160]
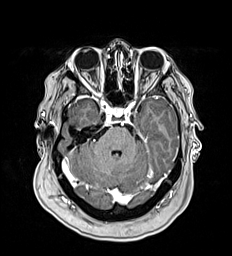
[im 60/160]
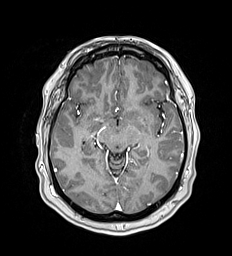
[im 80/160]
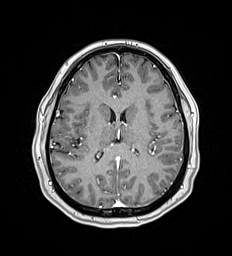
[im 100/160]
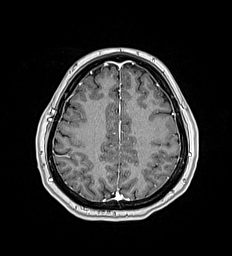
[im 120/160]
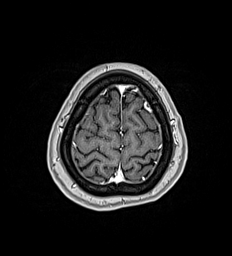
[im 140/160]
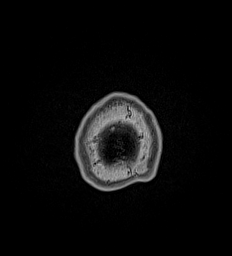
[im 160/160]
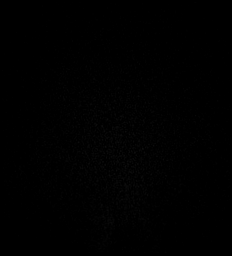

[Series 19: T1 post-contrast · coronal · 5.0mm · 0.57mm/px · 2 of 29 slices shown (2 of 2)]
[im 1/29]
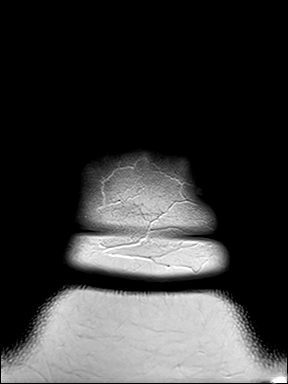
[im 29/29]
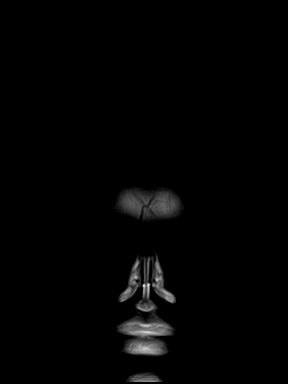

[48 of 48 positions shown; findings below may reference images not displayed]

FINDINGS: Brain: No acute infarction, hemorrhage, hydrocephalus, extra-axial
collection or mass lesion. The brain parenchyma has normal
morphology and signal characteristics. No focus of abnormal contrast
enhancement.

Vascular: Normal flow voids.

Skull and upper cervical spine: Decreased T1 signal in the
visualized upper cervical spine and clivus, may represent red marrow
reconversion.

Sinuses/Orbits: Negative.

Other: None.
IMPRESSION: Unremarkable MRI of the brain.

## 2021-12-18 MED ORDER — GADOBUTROL 1 MMOL/ML IV SOLN
10.0000 mL | Freq: Once | INTRAVENOUS | Status: AC | PRN
  Administered 2021-12-18: 10 mL via INTRAVENOUS

## 2021-12-20 ENCOUNTER — Other Ambulatory Visit: Payer: Self-pay | Admitting: Ophthalmology

## 2021-12-20 DIAGNOSIS — G932 Benign intracranial hypertension: Secondary | ICD-10-CM

## 2022-01-01 ENCOUNTER — Other Ambulatory Visit: Payer: Self-pay

## 2022-01-01 ENCOUNTER — Ambulatory Visit
Admission: RE | Admit: 2022-01-01 | Discharge: 2022-01-01 | Disposition: A | Discharge status: Home / Self Care | Payer: Medicaid Other | Source: Ambulatory Visit | Attending: Ophthalmology | Admitting: Ophthalmology

## 2022-01-01 DIAGNOSIS — G932 Benign intracranial hypertension: Secondary | ICD-10-CM | POA: Insufficient documentation

## 2022-01-01 IMAGING — MR MR MRV HEAD WO/W CM
4 of 7 series · 22 of 48 positions shown · IV contrast (10ml Gadavist)
Comparison: Brain MRI [DATE].

CLINICAL DATA: Provided history: Pseudotumor cerebri syndrome.
Additional history provided by scanning technologist: Patient
reports seeing "black spots "on the right, headaches.

EXAM:
MR VENOGRAM HEAD WITHOUT AND WITH CONTRAST
TECHNIQUE: Angiographic images of the intracranial venous structures were
acquired using MRV technique without and with intravenous contrast.
CONTRAST:  10mL GADAVIST GADOBUTROL 1 MMOL/ML IV SOLN

[Series 5: TOF · coronal · 2.5mm · 0.98mm/px · 6 of 110 slices shown]
[im 1/110]
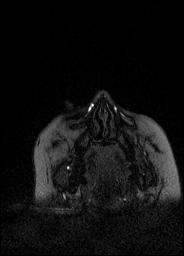
[im 22/110]
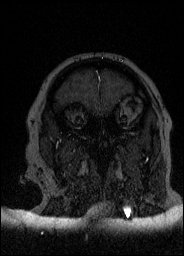
[im 44/110]
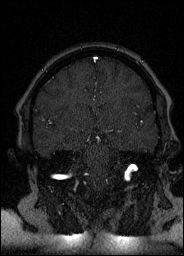
[im 66/110]
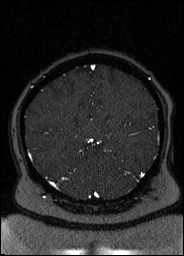
[im 88/110]
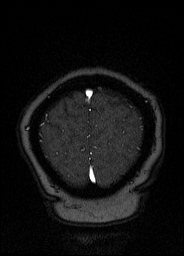
[im 110/110]
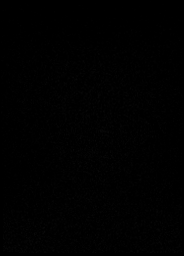

[Series 12: T1 · sagittal · 1.0mm · 0.98mm/px · 11 of 176 slices shown]
[im 1/176]
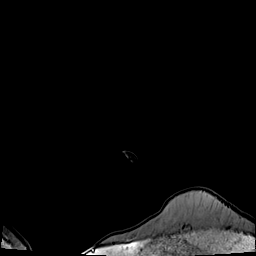
[im 18/176]
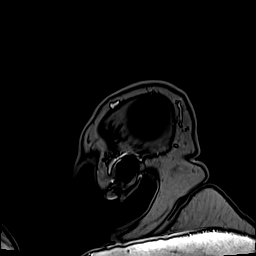
[im 36/176]
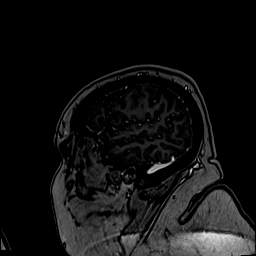
[im 53/176]
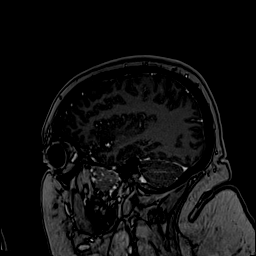
[im 71/176]
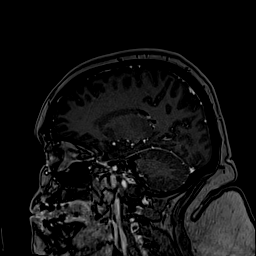
[im 88/176]
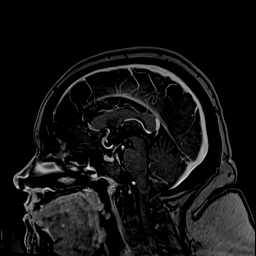
[im 106/176]
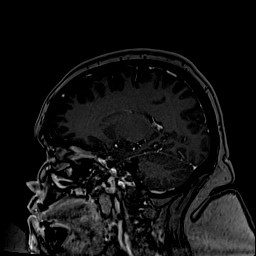
[im 123/176]
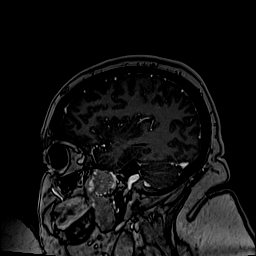
[im 141/176]
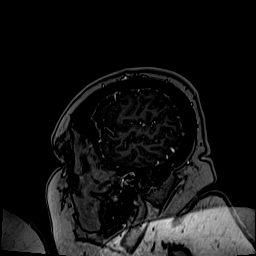
[im 158/176]
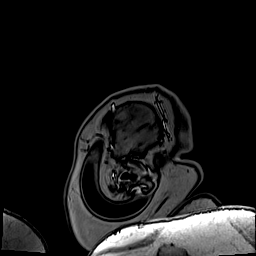
[im 176/176]
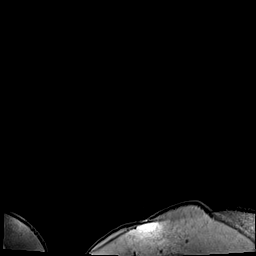

[Series 1016: 2d h-f · axial · 356.8mm · 0.30mm/px · 1 of 1 slices shown]
[im 1/1]
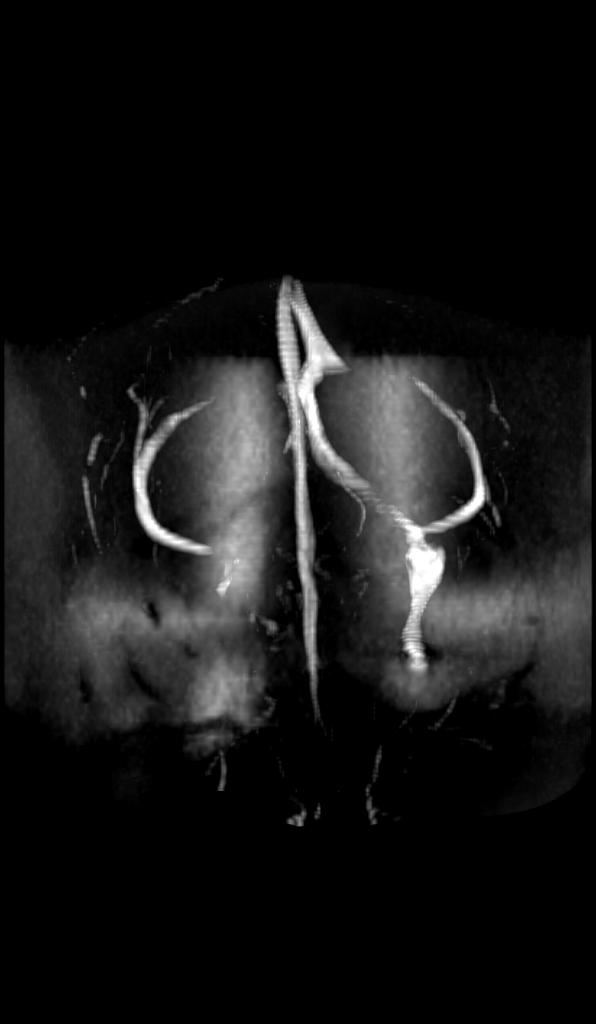

[Series 1038: coronal · coronal · 1.0mm · 0.26mm/px · 4 of 186 slices shown]
[im 1/186]
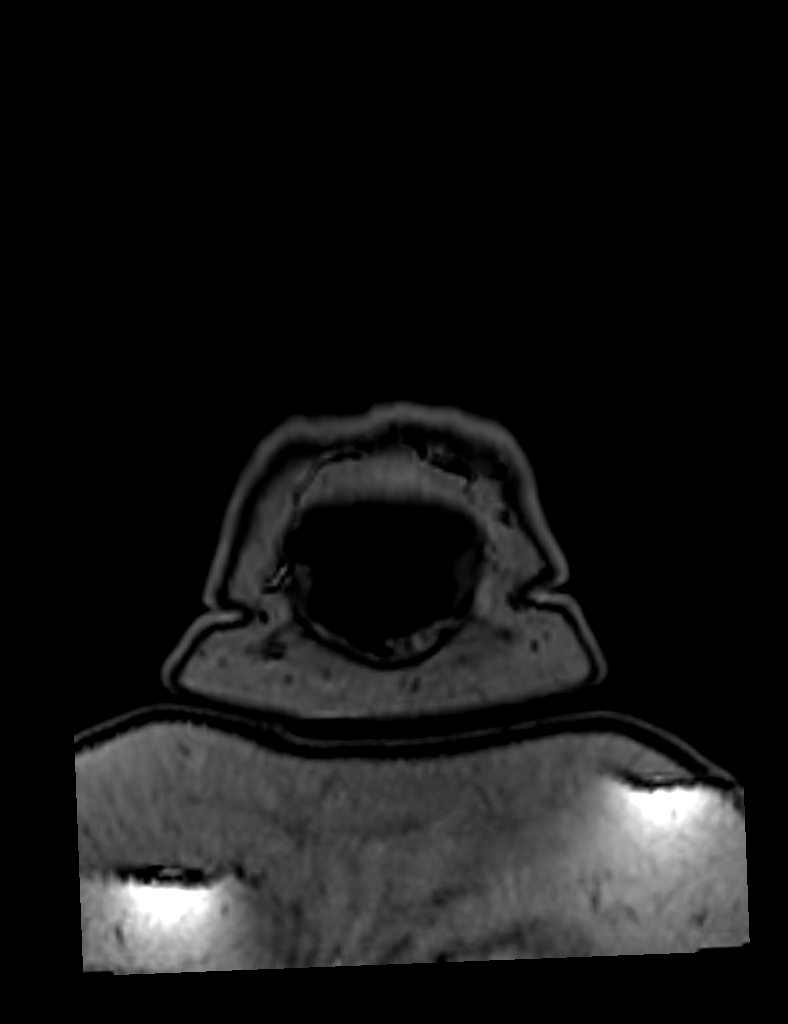
[im 19/186]
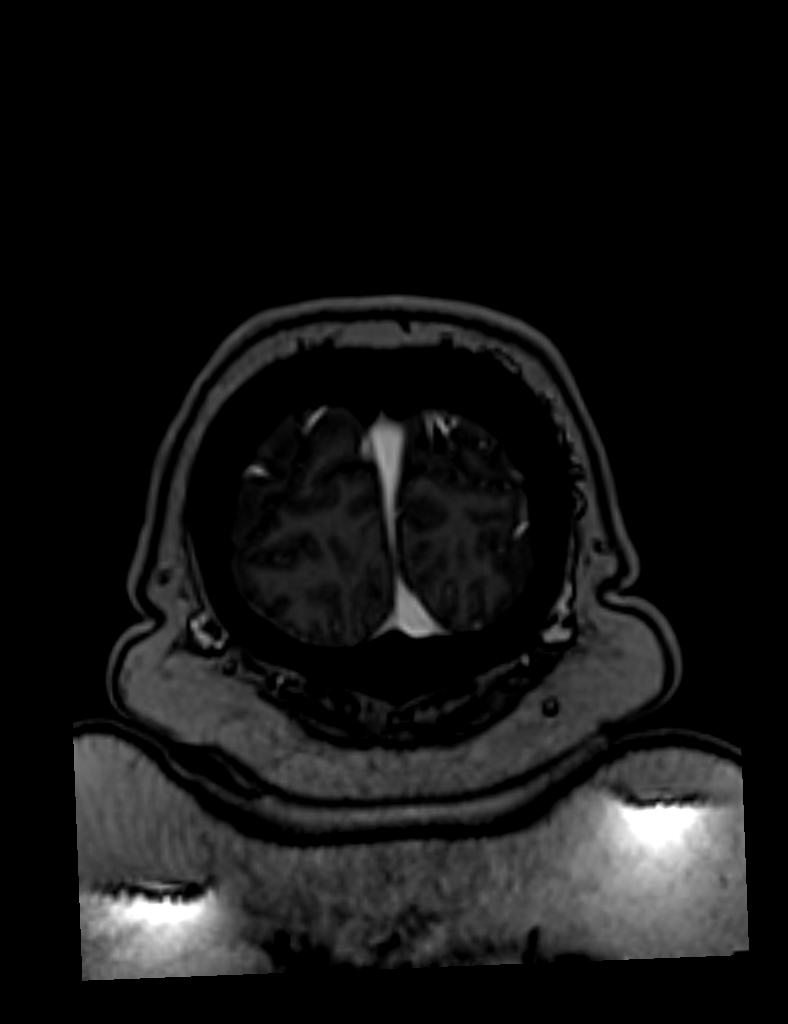
[im 93/186]
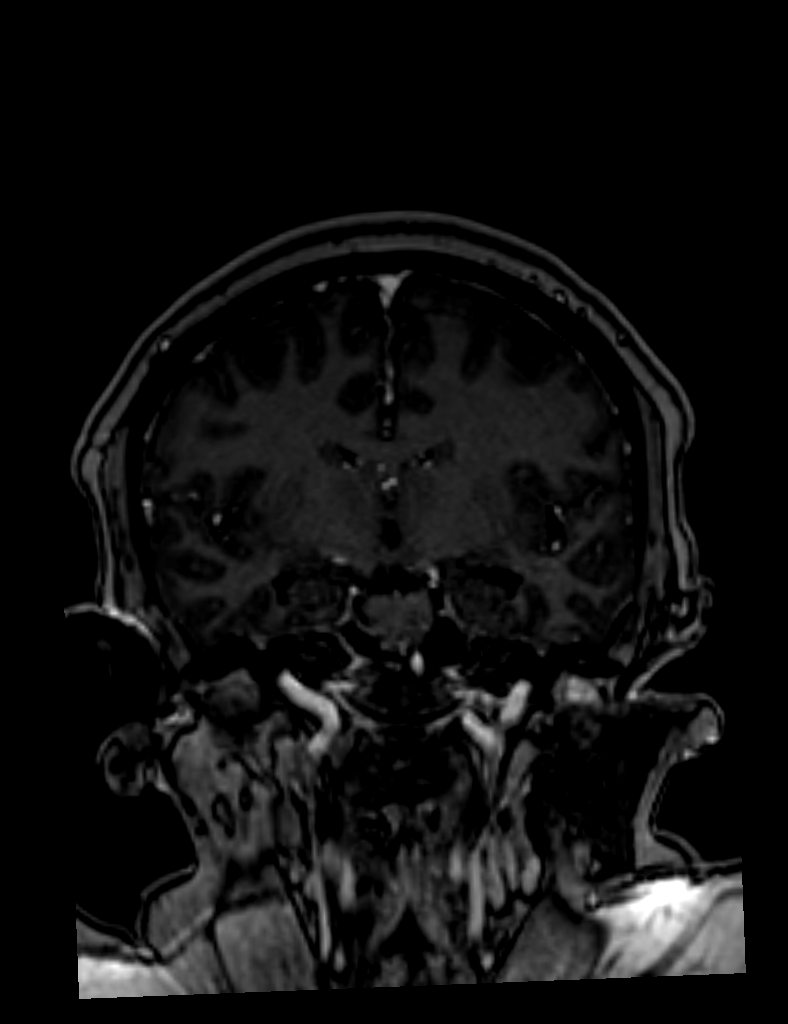
[im 167/186]
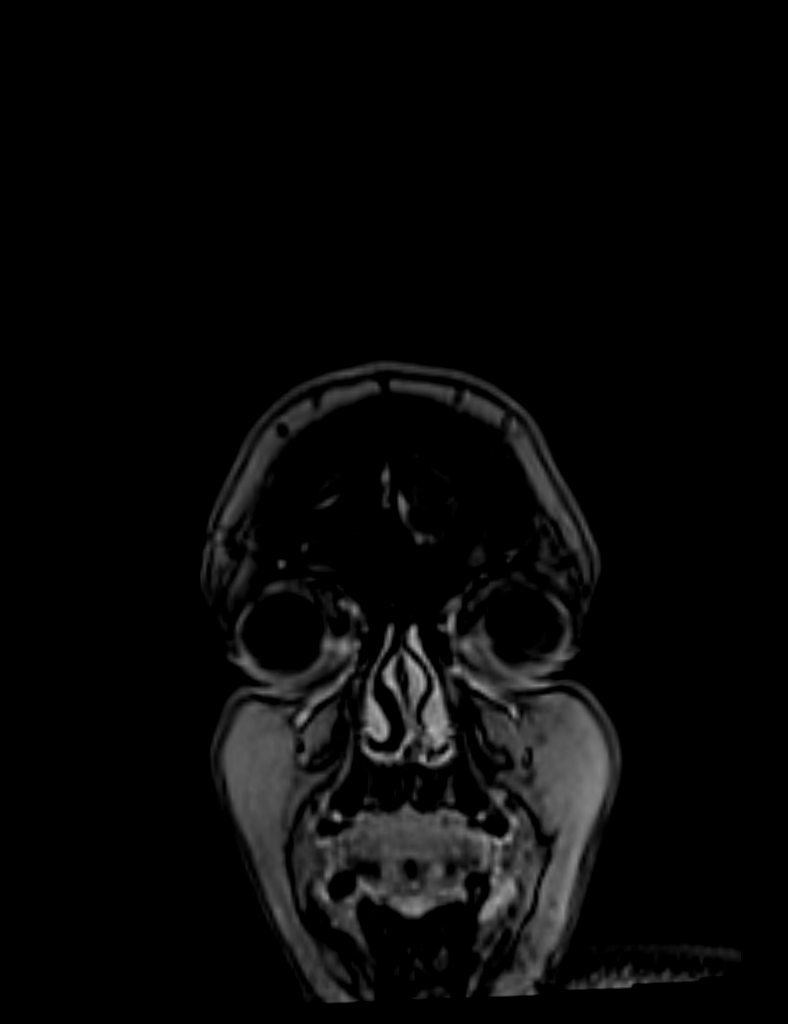

[22 of 48 positions shown; findings below may reference images not displayed]

FINDINGS: The superior sagittal sinus, internal cerebral veins, vein of JERSAIN,
straight sinus, transverse sinuses, sigmoid sinuses and visualized
jugular veins are patent. There is no appreciable intracranial
venous thrombosis. No convincing dural venous sinus stenosis. No
pathologic intracranial enhancement is identified on the whole brain
post-contrast imaging.
IMPRESSION: Unremarkable examination.

No evidence of intracranial venous thrombosis.

No convincing dural venous sinus stenosis.

## 2022-01-01 MED ORDER — GADOBUTROL 1 MMOL/ML IV SOLN
10.0000 mL | Freq: Once | INTRAVENOUS | Status: AC | PRN
  Administered 2022-01-01: 10 mL via INTRAVENOUS

## 2022-12-26 ENCOUNTER — Encounter: Payer: Self-pay | Admitting: Emergency Medicine

## 2022-12-26 ENCOUNTER — Ambulatory Visit
Admission: EM | Admit: 2022-12-26 | Discharge: 2022-12-26 | Disposition: A | Discharge status: Home / Self Care | Payer: Medicaid Other | Attending: Physician Assistant | Admitting: Physician Assistant

## 2022-12-26 DIAGNOSIS — H6690 Otitis media, unspecified, unspecified ear: Secondary | ICD-10-CM | POA: Diagnosis not present

## 2022-12-26 DIAGNOSIS — Z1152 Encounter for screening for COVID-19: Secondary | ICD-10-CM | POA: Insufficient documentation

## 2022-12-26 DIAGNOSIS — R058 Other specified cough: Secondary | ICD-10-CM | POA: Insufficient documentation

## 2022-12-26 LAB — RESP PANEL BY RT-PCR (RSV, FLU A&B, COVID)  RVPGX2
Influenza A by PCR: NEGATIVE
Influenza B by PCR: NEGATIVE
Resp Syncytial Virus by PCR: NEGATIVE
SARS Coronavirus 2 by RT PCR: NEGATIVE

## 2022-12-26 LAB — GROUP A STREP BY PCR: Group A Strep by PCR: NOT DETECTED

## 2022-12-26 MED ORDER — CEFDINIR 300 MG PO CAPS: 300.0000 mg | ORAL_CAPSULE | Freq: Two times a day (BID) | ORAL | 0 refills | Status: AC

## 2022-12-26 NOTE — ED Provider Notes (Signed)
[facility]    CSN: [id number] Arrival date & time: 12/26/22  1348      History   Chief Complaint Chief Complaint  Patient presents with   Nasal Congestion   Cough    HPI Suzanne Frederick is a 27 y.o. female.   HPI She is in today with nasal congestion right ear pain and cough that started on Wednesday.  She reports that she has not tried any over-the-counter medication.  She does have a history of allergies and waits until April to start treating.  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  She denies any fever, chills shortness of breath, chest pain, nausea or vomiting.. Past Medical History:  Diagnosis Date   Migraines    Treacher Collins syndrome     Patient Active Problem List   Diagnosis Date Noted   Severe preeclampsia, third trimester 10/26/2018   LGA (large for gestational age) fetus affecting management of mother 10/19/2018   Polyhydramnios affecting pregnancy 10/19/2018   Non-compliant patient 10/19/2018   GDM (gestational diabetes mellitus) 09/04/2018   BMI 50.0-59.9, adult ([place]) 06/08/2018   Treat as a patient with Chronic HTN 06/08/2018   Abnormal glucose affecting pregnancy 06/08/2018   Nausea and vomiting of pregnancy, antepartum 05/11/2018   ADHD (attention deficit hyperactivity disorder) 02/22/2013   Depression 02/22/2013   Menorrhagia 02/22/2013   Headache 02/22/2013   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 Authorizing Provider  cefdinir (OMNICEF) 300 MG capsule Take 1 capsule (300 mg total) by mouth 2 (two) times daily for 7 days. 12/26/22 01/02/23 Yes Sheral Pfahler, Diona Foley, NP  VICTOZA 18 MG/3ML SOPN Inject 1.8 mg into the skin daily. 12/08/22  Yes [provider]  medroxyPROGESTERone (DEPO-PROVERA) 150 MG/ML injection Inject 1 mL (150 mg total) into the muscle every 3 (three) months. 05/10/19   Aletha Halim, MD  Prenatal Vit-Fe Fumarate-FA (PRENATAL VITAMIN) 27-0.8 MG TABS Take 1 tablet by mouth daily. 07/12/18   Emily Filbert, MD  propranolol (INDERAL) 40 MG tablet Take 40 mg by mouth daily.  05/22/14   [provider]    Family History Family History  Problem Relation Age of Onset   Treacher Collins syndrome Sister    ADD / ADHD Sister    Diabetes Mother    Hypertension Mother    Hypertension Father    Hypertension Paternal Uncle    Hypertension Maternal Grandfather    Hypertension Paternal Grandmother    Hyperlipidemia Paternal Grandmother    Hypertension Paternal Grandfather    Hyperlipidemia Paternal Grandfather    Cancer Maternal Uncle     Social History Social History  Tobacco Use   Smoking status: Some Days    Types: Cigarettes    Last attempt to quit: 03/11/2018    Years since quitting: 4.7   Smokeless tobacco: Never  Vaping Use   Vaping Use: Never used  Substance Use Topics   Alcohol use: No   Drug use: Never     Allergies   Patient has no known allergies.   Review of Systems Review of Systems   Physical Exam Triage Vital Signs ED Triage Vitals  Enc Vitals Group     BP 12/26/22 1407 134/87     Pulse Rate 12/26/22 1407 (!) 102     Resp 12/26/22 1407 14     Temp 12/26/22 1407 99.6 F (37.6 C)     Temp Source 12/26/22 1407 Oral     SpO2 12/26/22 1407 96 %     Weight 12/26/22 1405 251 lb 15.8 oz (114.3 kg)     Height 12/26/22 1405 4\' 11"  (1.499 m)     Head Circumference --      Peak Flow --      Pain Score 12/26/22 1404  5     Pain Loc --      Pain Edu? --      Excl. in [place]? --    No data found.  Updated Vital Signs BP 134/87 (BP Location: Left Arm)   Pulse (!) 102   Temp 99.6 F (37.6 C) (Oral)   Resp 14   Ht 4\' 11"  (1.499 m)   Wt 251 lb 15.8 oz (114.3 kg)   SpO2 96%   Breastfeeding No   BMI 50.89 kg/m   Visual Acuity Right Eye Distance:   Left Eye Distance:   Bilateral Distance:    Right Eye Near:   Left Eye Near:    Bilateral Near:     Physical Exam Constitutional:      General: She is not in acute distress.    Appearance: She is obese.  HENT:     Head: Normocephalic and atraumatic.     Right Ear: There is no impacted cerumen.     Left Ear: Tympanic membrane normal.     Nose: Nose normal.     Mouth/Throat:     Mouth: Mucous membranes are moist.  Eyes:     Extraocular Movements: Extraocular movements intact.     Pupils: Pupils are equal, round, and reactive to light.  Cardiovascular:     Rate and Rhythm: Normal rate.     Pulses: Normal pulses.     Heart sounds: Normal heart sounds.  Pulmonary:     Effort: Pulmonary effort is normal.     Breath sounds: Normal breath sounds.  Musculoskeletal:        General: Normal range of motion.     Cervical back: Normal range of motion.  Skin:    General: Skin is warm.     Capillary Refill: Capillary refill takes less than 2 seconds.  Neurological:     General: No focal deficit present.     Mental Status: She is alert.  Psychiatric:        Mood and Affect: Mood normal.        Behavior: Behavior normal.      UC Treatments / Results  Labs (all labs ordered are listed, but only abnormal results are displayed) Labs Reviewed  RESP PANEL BY RT-PCR (RSV, FLU A&B, COVID)  [id number]  GROUP A STREP BY PCR    EKG   Radiology No  results found.  Procedures Procedures (including critical care time)  Medications Ordered in UC Medications - No data to display  Initial Impression / Assessment and Plan / UC Course  I have reviewed  the triage vital signs and the nursing notes.  Pertinent labs & imaging results that were available during my care of the patient were reviewed by me and considered in my medical decision making (see chart for details).     Nasal congestion Final Clinical Impressions(s) / UC Diagnoses   Final diagnoses:  Acute otitis media, unspecified otitis media type     Discharge Instructions      Your Strep Test is negative.   Your COVID is pending. Your results will show in your MyChart. Any positive results will result in a phone call from a nurse with next steps in treatment and recommendations.   You may have Otitis Media. You have been prescribed Omincef twice daily We encourage conservative treatment with symptom relief. We encourage you to use Tylenol alternating with Ibuprofen for your fever if not contraindicated. (Remember to use as directed do not exceed daily dosing recommendations) We also encourage salt water gargles for your sore throat. You should also consider throat lozenges and chloraseptic spray.  Your cough can be soothed with a cough suppressant.      ED Prescriptions     Medication Sig Dispense Auth. Provider   cefdinir (OMNICEF) 300 MG capsule Take 1 capsule (300 mg total) by mouth 2 (two) times daily for 7 days. 14 capsule Vevelyn Francois, NP      PDMP not reviewed this encounter.   Dionisio David [place], [place] 12/26/22 [phone]

## 2022-12-26 NOTE — Discharge Instructions (Addendum)
Your Strep Test is negative.   Your COVID is pending. Your results will show in your MyChart. Any positive results will result in a phone call from a nurse with next steps in treatment and recommendations.   You may have Otitis Media. You have been prescribed Omincef twice daily We encourage conservative treatment with symptom relief. We encourage you to use Tylenol alternating with Ibuprofen for your fever if not contraindicated. (Remember to use as directed do not exceed daily dosing recommendations) We also encourage salt water gargles for your sore throat. You should also consider throat lozenges and chloraseptic spray.  Your cough can be soothed with a cough suppressant.

## 2022-12-26 NOTE — ED Triage Notes (Signed)
Patient reports nasal congestion, runny nose and cough that started on Wed.  Patient denies fevers.

## 2023-03-01 ENCOUNTER — Other Ambulatory Visit: Payer: Self-pay

## 2023-03-01 ENCOUNTER — Emergency Department
Admission: EM | Admit: 2023-03-01 | Discharge: 2023-03-01 | Disposition: A | Discharge status: Home / Self Care | Payer: Medicaid Other | Attending: Emergency Medicine | Admitting: Emergency Medicine

## 2023-03-01 DIAGNOSIS — H1089 Other conjunctivitis: Secondary | ICD-10-CM | POA: Diagnosis not present

## 2023-03-01 DIAGNOSIS — H5789 Other specified disorders of eye and adnexa: Secondary | ICD-10-CM | POA: Diagnosis present

## 2023-03-01 DIAGNOSIS — H1033 Unspecified acute conjunctivitis, bilateral: Secondary | ICD-10-CM

## 2023-03-01 MED ORDER — CIPROFLOXACIN HCL 0.3 % OP SOLN: 1.0000 [drp] | Freq: Four times a day (QID) | OPHTHALMIC | 0 refills | Status: AC

## 2023-03-01 NOTE — ED Provider Notes (Signed)
[facility] Provider Note  Patient Contact: 5:23 PM (approximate)   History   Eye Problem   HPI  Suzanne Frederick is a 27 y.o. female who presents emerged department with bilateral eye irritation and drainage.  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  Patient has irritation bilaterally.  Does not wear glasses or contacts.     Physical Exam   Triage Vital Signs: ED Triage Vitals  Enc Vitals Group     BP 03/01/23 1503 (!) 125/91     Pulse Rate 03/01/23 1503 (!) 115     Resp 03/01/23 1503 20     Temp 03/01/23 1503 99.9 F (37.7 C)     Temp Source 03/01/23 1503 Oral     SpO2 03/01/23 1503 96 %     Weight 03/01/23 1451 253 lb 8.5 oz (115 kg)     Height 03/01/23 1451 4\' 11"  (1.499 m)     Head Circumference --      Peak Flow --      Pain Score 03/01/23 1451 0     Pain Loc --      Pain Edu? --      Excl. in [place]? --     Most recent vital signs: Vitals:   03/01/23 1503  BP: (!) 125/91  Pulse: (!) 115  Resp: 20  Temp: 99.9 F (37.7 C)  SpO2: 96%     General: Alert and in no acute distress. Eyes:  PERRL. EOMI. conjunctival erythema bilaterally with purulent drainage noted to both eyes.  Funduscopic exam is reassuring bilaterally.  Cardiovascular:  Good peripheral perfusion Respiratory: Normal respiratory effort without tachypnea or retractions. Lungs CTAB.  Musculoskeletal: Full range of motion to all extremities.  Neurologic:  No gross focal neurologic deficits are appreciated.  Skin:   No rash noted Other:   ED Results / Procedures / Treatments   Labs (all labs ordered are listed, but only abnormal results are displayed) Labs Reviewed - No data to display   EKG     RADIOLOGY    No results found.  PROCEDURES:  Critical Care performed: No  Procedures   MEDICATIONS ORDERED IN ED: Medications - No data to display   IMPRESSION / MDM / ASSESSMENT AND PLAN / ED COURSE  I  reviewed the triage vital signs and the nursing notes.                                 Differential diagnosis includes, but is not limited to, conjunctivitis (bacterial, viral, allergic, chemical) hordeolum, corneal abrasion  Patient's presentation is most consistent with acute presentation with potential threat to life or bodily function.   Patient's diagnosis is consistent with bacterial conjunctivitis.  Patient presents emergency department with bilateral eye irritation and drainage from both eyes.  No visual changes.  Exam was reassuring and consistent with conjunctivitis.  Patient will have antibiotic eyedrops.  Follow-up with primary care as needed.. Patient is given ED precautions to return to the ED for any worsening or new symptoms.     FINAL CLINICAL IMPRESSION(S) / ED DIAGNOSES   Final diagnoses:  Acute bacterial conjunctivitis of both eyes     Rx / DC Orders   ED Discharge Orders          Ordered    ciprofloxacin (CILOXAN) 0.3 % ophthalmic solution  Every 6 hours  03/01/23 1756             Note:  This document was prepared using Dragon voice recognition software and may include unintentional dictation errors.   Racheal Patches, PA-C 03/01/23 1757    Concha Se, MD 03/01/23 1840

## 2023-03-01 NOTE — ED Triage Notes (Signed)
Pt reports Friday her eye started burning and then Saturday morning and this morning they were matted shut. 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

## 2023-03-23 ENCOUNTER — Encounter: Payer: Self-pay | Admitting: Emergency Medicine

## 2023-03-23 ENCOUNTER — Emergency Department
Admission: EM | Admit: 2023-03-23 | Discharge: 2023-03-23 | Disposition: A | Discharge status: Home / Self Care | Payer: Medicaid Other | Attending: Emergency Medicine | Admitting: Emergency Medicine

## 2023-03-23 ENCOUNTER — Other Ambulatory Visit: Payer: Self-pay

## 2023-03-23 DIAGNOSIS — H9202 Otalgia, left ear: Secondary | ICD-10-CM | POA: Diagnosis present

## 2023-03-23 MED ORDER — AZITHROMYCIN 250 MG PO TABS: ORAL_TABLET | ORAL | 0 refills | Status: AC

## 2023-03-23 NOTE — ED Provider Notes (Signed)
   [facility] Provider Note    Event Date/Time   First MD Initiated Contact with Patient 03/23/23 1544     (approximate)   History   Otalgia   HPI  Suzanne Frederick is a 27 y.o. female who presents with primary complaint of left ear pain, she denies dizziness, denies shortness of breath to me.  Woke up this morning with throbbing in her left ear, no other complaints     Physical Exam   Triage Vital Signs: ED Triage Vitals  Enc Vitals Group     BP 03/23/23 1540 (!) 142/78     Pulse Rate 03/23/23 1540 (!) 117     Resp 03/23/23 1540 16     Temp 03/23/23 1540 99 F (37.2 C)     Temp Source 03/23/23 1540 Oral     SpO2 03/23/23 1540 95 %     Weight 03/23/23 1541 115 kg (253 lb 8.5 oz)     Height --      Head Circumference --      Peak Flow --      Pain Score 03/23/23 1541 6     Pain Loc --      Pain Edu? --      Excl. in [place]? --     Most recent vital signs: Vitals:   03/23/23 1540  BP: (!) 142/78  Pulse: (!) 117  Resp: 16  Temp: 99 F (37.2 C)  SpO2: 95%     General: Awake, no distress.  CV:  Good peripheral perfusion.  Resp:  Normal effort.  Abd:  No distention.  Other:  Left ear with bulging left TM, erythematous, no rupture   ED Results / Procedures / Treatments   Labs (all labs ordered are listed, but only abnormal results are displayed) Labs Reviewed - No data to display   EKG     RADIOLOGY     PROCEDURES:  Critical Care performed:   Procedures   MEDICATIONS ORDERED IN ED: Medications - No data to display   IMPRESSION / MDM / ASSESSMENT AND PLAN / ED COURSE  I reviewed the triage vital signs and the nursing notes. Patient's presentation is most consistent with acute, uncomplicated illness.  Exam is consistent with otitis media, she has amoxicillin allergy, will treat with azithromycin, outpatient follow-up recommended, return precautions discussed.        FINAL CLINICAL IMPRESSION(S) / ED  DIAGNOSES   Final diagnoses:  Otalgia of left ear     Rx / DC Orders   ED Discharge Orders          Ordered    azithromycin (ZITHROMAX Z-PAK) 250 MG tablet        03/23/23 1545             Note:  This document was prepared using Dragon voice recognition software and may include unintentional dictation errors.   Jene Every, MD 03/23/23 [phone]

## 2023-03-23 NOTE — ED Triage Notes (Signed)
Woke up with pain in left ear today  throbbing

## 2023-05-05 ENCOUNTER — Telehealth: Payer: Self-pay

## 2023-05-05 NOTE — Telephone Encounter (Signed)
The patient is scheduled to see Debbe Odea, MD on 05/13/2023 as a new patient. Called the patient and was going to inquire about any previous cardiac history, but the patient states that she would call back on her lunch break.

## 2023-05-11 ENCOUNTER — Other Ambulatory Visit: Payer: Self-pay

## 2023-05-11 ENCOUNTER — Encounter: Payer: Self-pay | Admitting: Emergency Medicine

## 2023-05-11 ENCOUNTER — Emergency Department
Admission: EM | Admit: 2023-05-11 | Discharge: 2023-05-11 | Disposition: A | Discharge status: Home / Self Care | Payer: Medicaid Other | Attending: Emergency Medicine | Admitting: Emergency Medicine

## 2023-05-11 DIAGNOSIS — H1033 Unspecified acute conjunctivitis, bilateral: Secondary | ICD-10-CM

## 2023-05-11 DIAGNOSIS — H5789 Other specified disorders of eye and adnexa: Secondary | ICD-10-CM | POA: Diagnosis present

## 2023-05-11 MED ORDER — ERYTHROMYCIN 5 MG/GM OP OINT: 1.0000 | TOPICAL_OINTMENT | Freq: Every day | OPHTHALMIC | 0 refills | Status: AC

## 2023-05-11 NOTE — ED Provider Notes (Signed)
[facility] Provider Note    Event Date/Time   First MD Initiated Contact with Patient 05/11/23 1045     (approximate)   History   Conjunctivitis   HPI  Suzanne Frederick is a 27 y.o. female who presents today for evaluation of bilateral eye itchiness and redness and discharge.  She reports that it began in her right eye yesterday, and it spread to her left eye today.  She reports that her eyelashes are crusted shut when she wakes up in the morning.  She denies any change in her vision or eye pain.  She has not gotten anything stuck in her eye.  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  She denies wearing contact lenses.  Patient Active Problem List   Diagnosis Date Noted   Severe preeclampsia, third trimester 10/26/2018   LGA (large for gestational age) fetus affecting management of mother 10/19/2018   Polyhydramnios affecting pregnancy 10/19/2018   Non-compliant patient 10/19/2018   GDM (gestational diabetes mellitus) 09/04/2018   BMI 50.0-59.9, adult ([facility]) 06/08/2018   Treat as a patient with Chronic HTN 06/08/2018   Abnormal glucose affecting pregnancy 06/08/2018   Nausea and vomiting of pregnancy, antepartum 05/11/2018   ADHD (attention deficit hyperactivity disorder) 02/22/2013   Depression 02/22/2013   Menorrhagia 02/22/2013   Headache 02/22/2013   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          Physical Exam   Triage Vital Signs: ED Triage Vitals  Encounter Vitals Group     BP 05/11/23 0949 (!) 139/92     Systolic BP Percentile --      Diastolic BP Percentile --      Pulse Rate 05/11/23 0949 (!) 118     Resp 05/11/23 0949 18     Temp 05/11/23 0949 98.4 F (36.9 C)     Temp Source 05/11/23 0949 Oral     SpO2 05/11/23 0949 98 %     Weight 05/11/23 0945 280 lb (127 kg)     Height 05/11/23 0945 4\' 11"  (1.499 m)     Head Circumference --      Peak Flow --      Pain Score 05/11/23 0944 0     Pain Loc --      Pain Education --       Exclude from Growth Chart --     Most recent vital signs: Vitals:   05/11/23 0949 05/11/23 1112  BP: (!) 139/92 130/88  Pulse: (!) 118 100  Resp: 18 18  Temp: 98.4 F (36.9 C)   SpO2: 98% 98%    Physical Exam Vitals and nursing note reviewed.  Constitutional:      General: Awake and alert. No acute distress.    Appearance: Normal appearance. The patient is obese.  HENT:     Head: Normocephalic and atraumatic.     Mouth: Mucous membranes are moist.  Eyes:     General: PERRL. Normal EOMs        Right eye: Crusting along the eyelashes with scleral injection.  No hyphema or hypopyon.  Normal extraocular movements.  No periorbital erythema, edema, or ecchymosis.    Left eye:  Crusting along the eyelashes with scleral injection.  No hyphema or hypopyon.  Normal extraocular movements.  No periorbital erythema, edema, or ecchymosis.    Conjunctiva/sclera: Conjunctivae normal.  Cardiovascular:     Rate and Rhythm: Normal rate and regular rhythm.     Pulses: Normal pulses.  Pulmonary:  Effort: Pulmonary effort is normal. No respiratory distress.     Breath sounds: Normal breath sounds.  Abdominal:     Abdomen is soft. There is no abdominal tenderness. No rebound or guarding. No distention. Musculoskeletal:        General: No swelling. Normal range of motion.     Cervical back: Normal range of motion and neck supple.  Skin:    General: Skin is warm and dry.     Capillary Refill: Capillary refill takes less than 2 seconds.     Findings: No rash.  Neurological:     Mental Status: The patient is awake and alert.      ED Results / Procedures / Treatments   Labs (all labs ordered are listed, but only abnormal results are displayed) Labs Reviewed - No data to display   EKG     RADIOLOGY     PROCEDURES:  Critical Care performed:   Procedures   MEDICATIONS ORDERED IN ED: Medications - No data to display   IMPRESSION / MDM / ASSESSMENT AND PLAN / ED COURSE   I reviewed the triage vital signs and the nursing notes.   Differential diagnosis includes, but is not limited to, bacterial conjunctivitis, viral conjunctivitis, allergic conjunctivitis.  Patient is awake and alert, hemodynamically stable and afebrile.  She is nontoxic in appearance.  She has bilateral crusting along eyelashes, scleral injection consistent with conjunctivitis.  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  She has no hyphema or hypopyon.  There is no periorbital swelling, erythema, to suggest preseptal cellulitis.  She has full and normal extraocular movements, not consistent with orbital cellulitis.  There does not appear to be any foreign body, she declines any foreign body sensation.  She was started on erythromycin ointment.  We discussed proper hand hygiene.  She was given a work note.  We discussed return precautions outpatient follow-up.  Patient understands and agrees with plan.  She was discharged in stable condition.   Patient's presentation is most consistent with acute illness / injury with system symptoms.      FINAL CLINICAL IMPRESSION(S) / ED DIAGNOSES   Final diagnoses:  Acute conjunctivitis of both eyes, unspecified acute conjunctivitis type     Rx / DC Orders   ED Discharge Orders          Ordered    erythromycin ophthalmic ointment  Daily at bedtime        05/11/23 1059             Note:  This document was prepared using Dragon voice recognition software and may include unintentional dictation errors.   Jackelyn Hoehn, PA-C 05/11/23 1538    Phineas Semen, MD 05/11/23 [phone]

## 2023-05-11 NOTE — ED Notes (Signed)
See triage note  Presents with bilateral eye irritation and drainage   States sxs' started this weekend

## 2023-05-11 NOTE — ED Triage Notes (Signed)
Patient to ED via POV for bilateral pink eye. First noticed today. Having some drainage. Had a child spit in her face on friday.

## 2023-05-11 NOTE — Discharge Instructions (Signed)
Use the ointment as prescribed.  Please return for any new, worsening, or change in symptoms or other concerns.

## 2023-05-13 ENCOUNTER — Ambulatory Visit: Payer: Medicaid Other | Attending: Cardiology | Admitting: Cardiology

## 2023-05-14 ENCOUNTER — Encounter: Payer: Self-pay | Admitting: Cardiology

## 2024-10-12 ENCOUNTER — Encounter
